# Patient Record
Sex: Female | Born: 1960 | Race: White | Hispanic: No | Marital: Single | Smoking: Former smoker
Health system: Southern US, Community
[De-identification: ages and names within clinical notes are randomized; demographics above are authoritative.]

## PROBLEM LIST (undated history)

## (undated) DIAGNOSIS — F419 Anxiety disorder, unspecified: Secondary | ICD-10-CM

## (undated) DIAGNOSIS — M1990 Unspecified osteoarthritis, unspecified site: Secondary | ICD-10-CM

## (undated) DIAGNOSIS — Z9289 Personal history of other medical treatment: Secondary | ICD-10-CM

## (undated) DIAGNOSIS — K5792 Diverticulitis of intestine, part unspecified, without perforation or abscess without bleeding: Secondary | ICD-10-CM

## (undated) DIAGNOSIS — F329 Major depressive disorder, single episode, unspecified: Secondary | ICD-10-CM

## (undated) DIAGNOSIS — F32A Depression, unspecified: Secondary | ICD-10-CM

## (undated) DIAGNOSIS — R569 Unspecified convulsions: Secondary | ICD-10-CM

## (undated) DIAGNOSIS — G43909 Migraine, unspecified, not intractable, without status migrainosus: Secondary | ICD-10-CM

## (undated) DIAGNOSIS — F909 Attention-deficit hyperactivity disorder, unspecified type: Secondary | ICD-10-CM

## (undated) DIAGNOSIS — J189 Pneumonia, unspecified organism: Secondary | ICD-10-CM

## (undated) DIAGNOSIS — J449 Chronic obstructive pulmonary disease, unspecified: Secondary | ICD-10-CM

## (undated) DIAGNOSIS — K5909 Other constipation: Secondary | ICD-10-CM

## (undated) DIAGNOSIS — M329 Systemic lupus erythematosus, unspecified: Secondary | ICD-10-CM

## (undated) DIAGNOSIS — M2550 Pain in unspecified joint: Secondary | ICD-10-CM

## (undated) DIAGNOSIS — Z8709 Personal history of other diseases of the respiratory system: Secondary | ICD-10-CM

## (undated) DIAGNOSIS — Z9889 Other specified postprocedural states: Secondary | ICD-10-CM

## (undated) DIAGNOSIS — E785 Hyperlipidemia, unspecified: Secondary | ICD-10-CM

## (undated) DIAGNOSIS — R112 Nausea with vomiting, unspecified: Secondary | ICD-10-CM

## (undated) DIAGNOSIS — Z8619 Personal history of other infectious and parasitic diseases: Secondary | ICD-10-CM

## (undated) DIAGNOSIS — J302 Other seasonal allergic rhinitis: Secondary | ICD-10-CM

## (undated) DIAGNOSIS — E119 Type 2 diabetes mellitus without complications: Secondary | ICD-10-CM

## (undated) HISTORY — PX: COLONOSCOPY: SHX174

## (undated) HISTORY — PX: ABDOMINAL HYSTERECTOMY: SHX81

## (undated) HISTORY — PX: TUBAL LIGATION: SHX77

## (undated) HISTORY — PX: TONSILLECTOMY: SUR1361

## (undated) HISTORY — PX: OTHER SURGICAL HISTORY: SHX169

## (undated) HISTORY — PX: PARTIAL COLECTOMY: SHX5273

---

## 2015-12-21 DIAGNOSIS — Z8709 Personal history of other diseases of the respiratory system: Secondary | ICD-10-CM

## 2015-12-21 HISTORY — DX: Personal history of other diseases of the respiratory system: Z87.09

## 2016-03-05 ENCOUNTER — Other Ambulatory Visit: Payer: Self-pay | Admitting: Neurological Surgery

## 2016-03-16 NOTE — Progress Notes (Signed)
Nurse called and spoke with Erie NoeVanessa at Dr. Mervyn Gayitty's office about consent form having two C4-5 in EPIC. Erie NoeVanessa stated the third cervical should state "C5-6 instead of C4-5". Will write consent form as Erie NoeVanessa stated.

## 2016-03-17 ENCOUNTER — Inpatient Hospital Stay (HOSPITAL_COMMUNITY)
Admission: RE | Admit: 2016-03-17 | Discharge: 2016-03-17 | Disposition: A | Discharge status: Home / Self Care | Payer: Self-pay | Source: Ambulatory Visit

## 2016-03-22 ENCOUNTER — Encounter (HOSPITAL_COMMUNITY): Admission: RE | Payer: Self-pay | Source: Ambulatory Visit

## 2016-03-22 ENCOUNTER — Inpatient Hospital Stay (HOSPITAL_COMMUNITY): Admission: RE | Admit: 2016-03-22 | Payer: Medicaid Other | Source: Ambulatory Visit | Admitting: Neurological Surgery

## 2016-03-22 SURGERY — ANTERIOR CERVICAL DECOMPRESSION/DISCECTOMY FUSION 4 LEVELS
Anesthesia: General

## 2016-04-08 ENCOUNTER — Other Ambulatory Visit: Payer: Self-pay | Admitting: Neurological Surgery

## 2016-07-07 ENCOUNTER — Encounter (HOSPITAL_COMMUNITY): Payer: Self-pay

## 2016-07-07 ENCOUNTER — Encounter (HOSPITAL_COMMUNITY)
Admission: RE | Admit: 2016-07-07 | Discharge: 2016-07-07 | Disposition: A | Discharge status: Home / Self Care | Payer: Medicaid Other | Source: Ambulatory Visit | Attending: Neurological Surgery | Admitting: Neurological Surgery

## 2016-07-07 DIAGNOSIS — Z01812 Encounter for preprocedural laboratory examination: Secondary | ICD-10-CM | POA: Diagnosis not present

## 2016-07-07 HISTORY — DX: Other specified postprocedural states: Z98.890

## 2016-07-07 HISTORY — DX: Pain in unspecified joint: M25.50

## 2016-07-07 HISTORY — DX: Personal history of other diseases of the respiratory system: Z87.09

## 2016-07-07 HISTORY — DX: Depression, unspecified: F32.A

## 2016-07-07 HISTORY — DX: Nausea with vomiting, unspecified: R11.2

## 2016-07-07 HISTORY — DX: Hyperlipidemia, unspecified: E78.5

## 2016-07-07 HISTORY — DX: Unspecified osteoarthritis, unspecified site: M19.90

## 2016-07-07 HISTORY — DX: Type 2 diabetes mellitus without complications: E11.9

## 2016-07-07 HISTORY — DX: Personal history of other infectious and parasitic diseases: Z86.19

## 2016-07-07 HISTORY — DX: Chronic obstructive pulmonary disease, unspecified: J44.9

## 2016-07-07 HISTORY — DX: Systemic lupus erythematosus, unspecified: M32.9

## 2016-07-07 HISTORY — DX: Anxiety disorder, unspecified: F41.9

## 2016-07-07 HISTORY — DX: Attention-deficit hyperactivity disorder, unspecified type: F90.9

## 2016-07-07 HISTORY — DX: Migraine, unspecified, not intractable, without status migrainosus: G43.909

## 2016-07-07 HISTORY — DX: Pneumonia, unspecified organism: J18.9

## 2016-07-07 HISTORY — DX: Other seasonal allergic rhinitis: J30.2

## 2016-07-07 HISTORY — DX: Major depressive disorder, single episode, unspecified: F32.9

## 2016-07-07 HISTORY — DX: Personal history of other medical treatment: Z92.89

## 2016-07-07 HISTORY — DX: Other constipation: K59.09

## 2016-07-07 HISTORY — DX: Unspecified convulsions: R56.9

## 2016-07-07 HISTORY — DX: Diverticulitis of intestine, part unspecified, without perforation or abscess without bleeding: K57.92

## 2016-07-07 LAB — BASIC METABOLIC PANEL
Anion gap: 6 (ref 5–15)
BUN: 8 mg/dL (ref 6–20)
CO2: 27 mmol/L (ref 22–32)
CREATININE: 0.74 mg/dL (ref 0.44–1.00)
Calcium: 9.5 mg/dL (ref 8.9–10.3)
Chloride: 107 mmol/L (ref 101–111)
GFR calc Af Amer: >60 mL/min (ref >60)
GLUCOSE: 97 mg/dL (ref 65–99)
Potassium: 3.7 mmol/L (ref 3.5–5.1)
SODIUM: 140 mmol/L (ref 135–145)

## 2016-07-07 LAB — CBC
HCT: 43 % (ref 36.0–46.0)
Hemoglobin: 13.8 g/dL (ref 12.0–15.0)
MCH: 31.3 pg (ref 26.0–34.0)
MCHC: 32.1 g/dL (ref 30.0–36.0)
MCV: 97.5 fL (ref 78.0–100.0)
PLATELETS: 318 10*3/uL (ref 150–400)
RBC: 4.41 MIL/uL (ref 3.87–5.11)
RDW: 13.3 % (ref 11.5–15.5)
WBC: 7.1 10*3/uL (ref 4.0–10.5)

## 2016-07-07 LAB — SURGICAL PCR SCREEN
MRSA, PCR: NEGATIVE
STAPHYLOCOCCUS AUREUS: NEGATIVE

## 2016-07-07 MED ORDER — CEFAZOLIN SODIUM-DEXTROSE 2-4 GM/100ML-% IV SOLN: 2.0000 g | INTRAVENOUS | Status: DC

## 2016-07-07 NOTE — Pre-Procedure Instructions (Signed)
Hannah Macias  07/07/2016      [facility] - [address], [place] - [address] [place] [address] Phone: [phone] Fax: [phone]    Your procedure is scheduled on Mon, July 24 @ 7:30 AM  Report to [facility] Admitting at 5:30 AM  Call this number if you have problems the morning of surgery:  [phone]   Remember:  Do not eat food or drink liquids after midnight.  Take these medicines the morning of surgery with A SIP OF WATER Zovirax(Acyclovir),Albuterol<Bring Your Inhaler With You>,Claritin(Loratadine-if needed),Pain Pill(if needed), and Paxil(Paroxetine)              No Goody's,BC's,Aleve,Aspirin,Ibuprofen,Motrin,Advil,Fish Oil,or any Herbal Medications.     How to Manage Your Diabetes Before and After Surgery  Why is it important to control my blood sugar before and after surgery? . Improving blood sugar levels before and after surgery helps healing and can limit problems. . A way of improving blood sugar control is eating a healthy diet by: o  Eating less sugar and carbohydrates o  Increasing activity/exercise o  Talking with your doctor about reaching your blood sugar goals . High blood sugars (greater than 180 mg/dL) can raise your risk of infections and slow your recovery, so you will need to focus on controlling your diabetes during the weeks before surgery. . Make sure that the doctor who takes care of your diabetes knows about your planned surgery including the date and location.  How do I manage my blood sugar before surgery? . Check your blood sugar at least 4 times a day, starting 2 days before surgery, to make sure that the level is not too high or low. o Check your blood sugar the morning of your surgery when you wake up and every 2 hours until you get to the Short Stay unit. . If your blood sugar is less than 70 mg/dL, you will need to treat for low blood sugar: o Do not take insulin. o Treat a low  blood sugar (less than 70 mg/dL) with  cup of clear juice (cranberry or apple), 4 glucose tablets, OR glucose gel. o Recheck blood sugar in 15 minutes after treatment (to make sure it is greater than 70 mg/dL). If your blood sugar is not greater than 70 mg/dL on recheck, call [phone] for further instructions. . Report your blood sugar to the short stay nurse when you get to Short Stay.  . If you are admitted to the hospital after surgery: o Your blood sugar will be checked by the staff and you will probably be given insulin after surgery (instead of oral diabetes medicines) to make sure you have good blood sugar levels. o The goal for blood sugar control after surgery is 80-180 mg/dL.              WHAT DO I DO ABOUT MY DIABETES MEDICATION?   Marland Kitchen Do not take oral diabetes medicines (pills) the morning of surgery.  .        . The day of surgery, do not take other diabetes injectables, including Byetta (exenatide), Bydureon (exenatide ER), Victoza (liraglutide), or Trulicity (dulaglutide).  . If your CBG is greater than 220 mg/dL, you may take  of your sliding scale (correction) dose of insulin.  Other Instructions:          Patient Signature:  Date:   Nurse Signature:  Date:   Reviewed and Endorsed by [facility]  Patient Education Committee, August 2015   Do not wear jewelry, make-up or nail polish.  Do not wear lotions, powders, or perfumes.   Do not shave 48 hours prior to surgery.    Do not bring valuables to the hospital.  [facility] is not responsible for any belongings or valuables.  Contacts, dentures or bridgework may not be worn into surgery.  Leave your suitcase in the car.  After surgery it may be brought to your room.  For patients admitted to the hospital, discharge time will be determined by your treatment team.  Patients discharged the day of surgery will not be allowed to drive home.    [facility]  [facility]  Before surgery, you can play an important role.  Because skin is not sterile, your skin needs to be as free of germs as possible.  You can reduce the number of germs on you skin by washing with CHG (chlorahexidine gluconate) soap before surgery.  CHG is an antiseptic cleaner which kills germs and bonds with the skin to continue killing germs even after washing.  Please DO NOT use if you have an allergy to CHG or antibacterial soaps.  If your skin becomes reddened/irritated stop using the CHG and inform your nurse when you arrive at Short Stay.  Do not shave (including legs and underarms) for at least 48 hours prior to the first CHG shower.  You may shave your face.  Please follow these instructions carefully:   1.  Shower with CHG Soap the night before surgery and the                                morning of Surgery.  2.  If you choose to wash your hair, wash your hair first as usual with your       normal shampoo.  3.  After you shampoo, rinse your hair and body thoroughly to remove the                      Shampoo.  4.  Use CHG as you would any other liquid soap.  You can apply chg directly       to the skin and wash gently with scrungie or a clean washcloth.  5.  Apply the CHG Soap to your body ONLY FROM THE NECK DOWN.        Do not use on open wounds or open sores.  Avoid contact with your eyes,       ears, mouth and genitals (private parts).  Wash genitals (private parts)       with your normal soap.  6.  Wash thoroughly, paying special attention to the area where your surgery        will be performed.  7.  Thoroughly rinse your body with warm water from the neck down.  8.  DO NOT shower/wash with your normal soap after using and rinsing off       the CHG Soap.  9.  Pat yourself dry with a clean towel.            10.  Wear clean pajamas.            11.  Place clean sheets on your bed the night of your first shower and do not        sleep with pets.  Day of Surgery  Do not  apply  any lotions/deoderants the morning of surgery.  Please wear clean clothes to the hospital/surgery center.     Please read over the following fact sheets that you were given. Pain Booklet and MRSA Information

## 2016-07-07 NOTE — Progress Notes (Addendum)
Cardiologist denies  Medical [place]  Echo denies  Stress test denies  Heart cath denies  [facility]   [facility]

## 2016-07-08 LAB — HEMOGLOBIN A1C
HEMOGLOBIN A1C: 6.2 % — AB (ref 4.8–5.6)
MEAN PLASMA GLUCOSE: 131 mg/dL

## 2016-07-12 ENCOUNTER — Encounter (HOSPITAL_COMMUNITY): Payer: Self-pay | Admitting: Surgery

## 2016-07-12 ENCOUNTER — Inpatient Hospital Stay (HOSPITAL_COMMUNITY): Payer: Medicaid Other

## 2016-07-12 ENCOUNTER — Inpatient Hospital Stay (HOSPITAL_COMMUNITY): Payer: Medicaid Other | Admitting: Anesthesiology

## 2016-07-12 ENCOUNTER — Encounter (HOSPITAL_COMMUNITY)
Admission: RE | Disposition: A | Discharge status: Home / Self Care | Payer: Self-pay | Source: Ambulatory Visit | Attending: Neurological Surgery

## 2016-07-12 ENCOUNTER — Ambulatory Visit (HOSPITAL_COMMUNITY)
Admission: RE | Admit: 2016-07-12 | Discharge: 2016-07-13 | Disposition: A | Discharge status: Home / Self Care | Payer: Medicaid Other | Source: Ambulatory Visit | Attending: Neurological Surgery | Admitting: Neurological Surgery

## 2016-07-12 DIAGNOSIS — Z7951 Long term (current) use of inhaled steroids: Secondary | ICD-10-CM | POA: Diagnosis not present

## 2016-07-12 DIAGNOSIS — M4722 Other spondylosis with radiculopathy, cervical region: Principal | ICD-10-CM | POA: Diagnosis present

## 2016-07-12 DIAGNOSIS — F329 Major depressive disorder, single episode, unspecified: Secondary | ICD-10-CM | POA: Insufficient documentation

## 2016-07-12 DIAGNOSIS — F419 Anxiety disorder, unspecified: Secondary | ICD-10-CM | POA: Diagnosis not present

## 2016-07-12 DIAGNOSIS — E119 Type 2 diabetes mellitus without complications: Secondary | ICD-10-CM | POA: Insufficient documentation

## 2016-07-12 DIAGNOSIS — K5909 Other constipation: Secondary | ICD-10-CM | POA: Diagnosis not present

## 2016-07-12 DIAGNOSIS — Z79899 Other long term (current) drug therapy: Secondary | ICD-10-CM | POA: Insufficient documentation

## 2016-07-12 DIAGNOSIS — G43909 Migraine, unspecified, not intractable, without status migrainosus: Secondary | ICD-10-CM | POA: Diagnosis not present

## 2016-07-12 DIAGNOSIS — M329 Systemic lupus erythematosus, unspecified: Secondary | ICD-10-CM | POA: Diagnosis not present

## 2016-07-12 DIAGNOSIS — Z7984 Long term (current) use of oral hypoglycemic drugs: Secondary | ICD-10-CM | POA: Diagnosis not present

## 2016-07-12 DIAGNOSIS — E785 Hyperlipidemia, unspecified: Secondary | ICD-10-CM | POA: Diagnosis not present

## 2016-07-12 DIAGNOSIS — J449 Chronic obstructive pulmonary disease, unspecified: Secondary | ICD-10-CM | POA: Diagnosis not present

## 2016-07-12 DIAGNOSIS — Z87891 Personal history of nicotine dependence: Secondary | ICD-10-CM | POA: Insufficient documentation

## 2016-07-12 DIAGNOSIS — Z419 Encounter for procedure for purposes other than remedying health state, unspecified: Secondary | ICD-10-CM

## 2016-07-12 HISTORY — PX: ANTERIOR CERVICAL DECOMPRESSION/DISCECTOMY FUSION 4 LEVELS: SHX5556

## 2016-07-12 LAB — URINALYSIS, ROUTINE W REFLEX MICROSCOPIC
Bilirubin Urine: NEGATIVE
Glucose, UA: >1000 mg/dL — AB
KETONES UR: NEGATIVE mg/dL
LEUKOCYTES UA: NEGATIVE
NITRITE: NEGATIVE
PROTEIN: NEGATIVE mg/dL
Specific Gravity, Urine: 1.02 (ref 1.005–1.030)
pH: 7 (ref 5.0–8.0)

## 2016-07-12 LAB — GLUCOSE, CAPILLARY
GLUCOSE-CAPILLARY: 115 mg/dL — AB (ref 65–99)
GLUCOSE-CAPILLARY: 171 mg/dL — AB (ref 65–99)
GLUCOSE-CAPILLARY: 196 mg/dL — AB (ref 65–99)
Glucose-Capillary: 158 mg/dL — ABNORMAL HIGH (ref 65–99)

## 2016-07-12 LAB — URINE MICROSCOPIC-ADD ON

## 2016-07-12 SURGERY — ANTERIOR CERVICAL DECOMPRESSION/DISCECTOMY FUSION 4 LEVELS
Anesthesia: General

## 2016-07-12 MED ORDER — PROPOFOL 10 MG/ML IV BOLUS
INTRAVENOUS | Status: DC | PRN
  Administered 2016-07-12: 160 mg via INTRAVENOUS

## 2016-07-12 MED ORDER — ZOLPIDEM TARTRATE 5 MG PO TABS: 10.0000 mg | ORAL_TABLET | Freq: Every day | ORAL | Status: DC

## 2016-07-12 MED ORDER — GLYCOPYRROLATE 0.2 MG/ML IV SOSY
PREFILLED_SYRINGE | INTRAVENOUS | Status: AC
  Filled 2016-07-12: qty 3

## 2016-07-12 MED ORDER — LIDOCAINE 2% (20 MG/ML) 5 ML SYRINGE
INTRAMUSCULAR | Status: DC | PRN
  Administered 2016-07-12: 40 mg via INTRAVENOUS

## 2016-07-12 MED ORDER — ONDANSETRON HCL 4 MG/2ML IJ SOLN
INTRAMUSCULAR | Status: DC | PRN
  Administered 2016-07-12: 4 mg via INTRAVENOUS

## 2016-07-12 MED ORDER — GLYCOPYRROLATE 0.2 MG/ML IV SOSY
PREFILLED_SYRINGE | INTRAVENOUS | Status: DC | PRN
  Administered 2016-07-12: .4 mg via INTRAVENOUS

## 2016-07-12 MED ORDER — MONTELUKAST SODIUM 10 MG PO TABS: 10.0000 mg | ORAL_TABLET | Freq: Every day | ORAL | Status: DC

## 2016-07-12 MED ORDER — SENNA 8.6 MG PO TABS
1.0000 | ORAL_TABLET | Freq: Two times a day (BID) | ORAL | Status: DC
  Administered 2016-07-12: 8.6 mg via ORAL
  Filled 2016-07-12: qty 1

## 2016-07-12 MED ORDER — SODIUM CHLORIDE 0.9% FLUSH: 3.0000 mL | INTRAVENOUS | Status: DC | PRN

## 2016-07-12 MED ORDER — ROCURONIUM BROMIDE 10 MG/ML (PF) SYRINGE
PREFILLED_SYRINGE | INTRAVENOUS | Status: DC | PRN
  Administered 2016-07-12: 10 mg via INTRAVENOUS
  Administered 2016-07-12: 50 mg via INTRAVENOUS
  Administered 2016-07-12 (×3): 10 mg via INTRAVENOUS

## 2016-07-12 MED ORDER — PAROXETINE HCL 10 MG PO TABS
5.0000 mg | ORAL_TABLET | Freq: Every day | ORAL | Status: DC
  Administered 2016-07-13: 5 mg via ORAL
  Filled 2016-07-12: qty 0.5

## 2016-07-12 MED ORDER — OXYCODONE HCL 5 MG PO TABS: 5.0000 mg | ORAL_TABLET | ORAL | Status: DC | PRN

## 2016-07-12 MED ORDER — EPHEDRINE 5 MG/ML INJ
INTRAVENOUS | Status: AC
  Filled 2016-07-12: qty 10

## 2016-07-12 MED ORDER — HYDROMORPHONE HCL 1 MG/ML IJ SOLN
0.5000 mg | INTRAMUSCULAR | Status: DC | PRN
  Administered 2016-07-12 (×2): 1 mg via INTRAVENOUS
  Filled 2016-07-12 (×2): qty 1

## 2016-07-12 MED ORDER — CARISOPRODOL 350 MG PO TABS
350.0000 mg | ORAL_TABLET | Freq: Four times a day (QID) | ORAL | Status: DC | PRN
  Administered 2016-07-12 – 2016-07-13 (×2): 350 mg via ORAL
  Filled 2016-07-12 (×2): qty 1

## 2016-07-12 MED ORDER — DEXAMETHASONE SODIUM PHOSPHATE 4 MG/ML IJ SOLN
2.0000 mg | Freq: Three times a day (TID) | INTRAMUSCULAR | Status: AC
  Administered 2016-07-12 – 2016-07-13 (×3): 2 mg via INTRAVENOUS
  Filled 2016-07-12 (×3): qty 1

## 2016-07-12 MED ORDER — FLUTICASONE PROPIONATE 50 MCG/ACT NA SUSP
2.0000 | Freq: Every day | NASAL | Status: DC
  Administered 2016-07-12: 2 via NASAL
  Filled 2016-07-12: qty 16

## 2016-07-12 MED ORDER — HYDROXYCHLOROQUINE SULFATE 200 MG PO TABS
200.0000 mg | ORAL_TABLET | Freq: Two times a day (BID) | ORAL | Status: DC
Start: 2016-07-12 — End: 2016-07-13
  Administered 2016-07-12 – 2016-07-13 (×2): 200 mg via ORAL
  Filled 2016-07-12 (×2): qty 1

## 2016-07-12 MED ORDER — DEXAMETHASONE SODIUM PHOSPHATE 10 MG/ML IJ SOLN
INTRAMUSCULAR | Status: DC | PRN
  Administered 2016-07-12 (×2): 5 mg via INTRAVENOUS

## 2016-07-12 MED ORDER — FENTANYL CITRATE (PF) 100 MCG/2ML IJ SOLN
INTRAMUSCULAR | Status: AC
  Filled 2016-07-12: qty 2

## 2016-07-12 MED ORDER — MIDAZOLAM HCL 2 MG/2ML IJ SOLN
INTRAMUSCULAR | Status: AC
  Filled 2016-07-12: qty 2

## 2016-07-12 MED ORDER — ONDANSETRON HCL 4 MG/2ML IJ SOLN
INTRAMUSCULAR | Status: AC
  Filled 2016-07-12: qty 2

## 2016-07-12 MED ORDER — DOCUSATE SODIUM 100 MG PO CAPS
100.0000 mg | ORAL_CAPSULE | Freq: Two times a day (BID) | ORAL | Status: DC
  Administered 2016-07-12: 100 mg via ORAL
  Filled 2016-07-12: qty 1

## 2016-07-12 MED ORDER — PROPOFOL 10 MG/ML IV BOLUS
INTRAVENOUS | Status: AC
  Filled 2016-07-12: qty 40

## 2016-07-12 MED ORDER — CARISOPRODOL 350 MG PO TABS: 700.0000 mg | ORAL_TABLET | Freq: Three times a day (TID) | ORAL | Status: DC | PRN

## 2016-07-12 MED ORDER — MIDAZOLAM HCL 5 MG/5ML IJ SOLN
INTRAMUSCULAR | Status: DC | PRN
  Administered 2016-07-12: 2 mg via INTRAVENOUS

## 2016-07-12 MED ORDER — DEXAMETHASONE SODIUM PHOSPHATE 10 MG/ML IJ SOLN
INTRAMUSCULAR | Status: AC
  Filled 2016-07-12: qty 2

## 2016-07-12 MED ORDER — TRAMADOL HCL 50 MG PO TABS: 50.0000 mg | ORAL_TABLET | Freq: Four times a day (QID) | ORAL | Status: DC | PRN

## 2016-07-12 MED ORDER — 0.9 % SODIUM CHLORIDE (POUR BTL) OPTIME
TOPICAL | Status: DC | PRN
  Administered 2016-07-12: 1000 mL

## 2016-07-12 MED ORDER — SODIUM CHLORIDE 0.9 % IV SOLN: INTRAVENOUS | Status: DC

## 2016-07-12 MED ORDER — FENTANYL CITRATE (PF) 100 MCG/2ML IJ SOLN
INTRAMUSCULAR | Status: DC | PRN
  Administered 2016-07-12 (×2): 25 ug via INTRAVENOUS
  Administered 2016-07-12 (×3): 50 ug via INTRAVENOUS
  Administered 2016-07-12: 25 ug via INTRAVENOUS
  Administered 2016-07-12: 50 ug via INTRAVENOUS
  Administered 2016-07-12: 25 ug via INTRAVENOUS
  Administered 2016-07-12: 100 ug via INTRAVENOUS
  Administered 2016-07-12: 50 ug via INTRAVENOUS

## 2016-07-12 MED ORDER — FENTANYL CITRATE (PF) 100 MCG/2ML IJ SOLN: 50.0000 ug | Freq: Once | INTRAMUSCULAR | Status: DC

## 2016-07-12 MED ORDER — ZOLPIDEM TARTRATE 5 MG PO TABS
5.0000 mg | ORAL_TABLET | Freq: Every evening | ORAL | Status: DC | PRN
  Administered 2016-07-12: 5 mg via ORAL
  Filled 2016-07-12: qty 1

## 2016-07-12 MED ORDER — THROMBIN 5000 UNITS EX SOLR
OROMUCOSAL | Status: DC | PRN
  Administered 2016-07-12 (×2): via TOPICAL
  Administered 2016-07-12: 5 mL via TOPICAL

## 2016-07-12 MED ORDER — LORATADINE 10 MG PO TABS: 10.0000 mg | ORAL_TABLET | Freq: Every day | ORAL | Status: DC | PRN

## 2016-07-12 MED ORDER — MENTHOL 3 MG MT LOZG: 1.0000 | LOZENGE | OROMUCOSAL | Status: DC | PRN

## 2016-07-12 MED ORDER — OXYCODONE-ACETAMINOPHEN 7.5-325 MG PO TABS
2.0000 | ORAL_TABLET | ORAL | Status: DC | PRN
  Administered 2016-07-12: 2 via ORAL
  Filled 2016-07-12: qty 2

## 2016-07-12 MED ORDER — CEFAZOLIN SODIUM 1 G IJ SOLR
INTRAMUSCULAR | Status: AC
  Filled 2016-07-12: qty 20

## 2016-07-12 MED ORDER — HYDROMORPHONE HCL 2 MG PO TABS
2.0000 mg | ORAL_TABLET | ORAL | Status: DC | PRN
  Administered 2016-07-12 – 2016-07-13 (×2): 2 mg via ORAL
  Filled 2016-07-12 (×2): qty 1

## 2016-07-12 MED ORDER — METFORMIN HCL 500 MG PO TABS
500.0000 mg | ORAL_TABLET | Freq: Three times a day (TID) | ORAL | Status: DC
  Administered 2016-07-12 – 2016-07-13 (×3): 500 mg via ORAL
  Filled 2016-07-12 (×3): qty 1

## 2016-07-12 MED ORDER — NEOSTIGMINE METHYLSULFATE 5 MG/5ML IV SOSY
PREFILLED_SYRINGE | INTRAVENOUS | Status: DC | PRN
  Administered 2016-07-12: 3 mg via INTRAVENOUS

## 2016-07-12 MED ORDER — CARISOPRODOL 350 MG PO TABS
350.0000 mg | ORAL_TABLET | Freq: Three times a day (TID) | ORAL | Status: DC | PRN
  Administered 2016-07-12: 350 mg via ORAL
  Filled 2016-07-12: qty 1

## 2016-07-12 MED ORDER — FENTANYL CITRATE (PF) 250 MCG/5ML IJ SOLN
INTRAMUSCULAR | Status: AC
  Filled 2016-07-12: qty 5

## 2016-07-12 MED ORDER — LIDOCAINE 2% (20 MG/ML) 5 ML SYRINGE
INTRAMUSCULAR | Status: AC
  Filled 2016-07-12: qty 5

## 2016-07-12 MED ORDER — BISACODYL 10 MG RE SUPP: 10.0000 mg | Freq: Every day | RECTAL | Status: DC | PRN

## 2016-07-12 MED ORDER — SUMATRIPTAN SUCCINATE 25 MG PO TABS
25.0000 mg | ORAL_TABLET | ORAL | Status: DC | PRN
  Administered 2016-07-12: 25 mg via ORAL
  Filled 2016-07-12 (×3): qty 1

## 2016-07-12 MED ORDER — EPHEDRINE SULFATE-NACL 50-0.9 MG/10ML-% IV SOSY
PREFILLED_SYRINGE | INTRAVENOUS | Status: DC | PRN
  Administered 2016-07-12: 5 mg via INTRAVENOUS
  Administered 2016-07-12: 10 mg via INTRAVENOUS

## 2016-07-12 MED ORDER — PANTOPRAZOLE SODIUM 20 MG PO TBEC
20.0000 mg | DELAYED_RELEASE_TABLET | Freq: Every day | ORAL | Status: DC
  Administered 2016-07-12 – 2016-07-13 (×2): 20 mg via ORAL
  Filled 2016-07-12 (×2): qty 1

## 2016-07-12 MED ORDER — LACTATED RINGERS IV SOLN
INTRAVENOUS | Status: DC | PRN
  Administered 2016-07-12 (×3): via INTRAVENOUS

## 2016-07-12 MED ORDER — ACYCLOVIR 200 MG PO CAPS
200.0000 mg | ORAL_CAPSULE | Freq: Two times a day (BID) | ORAL | Status: DC
Start: 2016-07-12 — End: 2016-07-13
  Administered 2016-07-12 – 2016-07-13 (×2): 200 mg via ORAL
  Filled 2016-07-12 (×2): qty 1

## 2016-07-12 MED ORDER — ALBUTEROL SULFATE (2.5 MG/3ML) 0.083% IN NEBU: 3.0000 mL | INHALATION_SOLUTION | Freq: Four times a day (QID) | RESPIRATORY_TRACT | Status: DC | PRN

## 2016-07-12 MED ORDER — FLEET ENEMA 7-19 GM/118ML RE ENEM: 1.0000 | ENEMA | Freq: Once | RECTAL | Status: DC | PRN

## 2016-07-12 MED ORDER — CEFAZOLIN IN D5W 1 GM/50ML IV SOLN
1.0000 g | Freq: Three times a day (TID) | INTRAVENOUS | Status: AC
  Administered 2016-07-12 (×2): 1 g via INTRAVENOUS
  Filled 2016-07-12 (×2): qty 50

## 2016-07-12 MED ORDER — ALPRAZOLAM 0.5 MG PO TABS: 1.0000 mg | ORAL_TABLET | Freq: Every day | ORAL | Status: DC

## 2016-07-12 MED ORDER — PHENOL 1.4 % MT LIQD
1.0000 | OROMUCOSAL | Status: DC | PRN
  Administered 2016-07-12: 1 via OROMUCOSAL
  Filled 2016-07-12: qty 177

## 2016-07-12 MED ORDER — ROCURONIUM BROMIDE 50 MG/5ML IV SOLN
INTRAVENOUS | Status: AC
  Filled 2016-07-12: qty 2

## 2016-07-12 MED ORDER — ACETAMINOPHEN 500 MG PO TABS
1000.0000 mg | ORAL_TABLET | Freq: Four times a day (QID) | ORAL | Status: DC
  Administered 2016-07-12 – 2016-07-13 (×4): 1000 mg via ORAL
  Filled 2016-07-12 (×4): qty 2

## 2016-07-12 MED ORDER — BUPIVACAINE-EPINEPHRINE (PF) 0.5% -1:200000 IJ SOLN
INTRAMUSCULAR | Status: DC | PRN
  Administered 2016-07-12: 10 mL via PERINEURAL

## 2016-07-12 MED ORDER — FENTANYL CITRATE (PF) 100 MCG/2ML IJ SOLN
25.0000 ug | INTRAMUSCULAR | Status: DC | PRN
  Administered 2016-07-12 (×2): 50 ug via INTRAVENOUS

## 2016-07-12 MED ORDER — BISACODYL 5 MG PO TBEC
10.0000 mg | DELAYED_RELEASE_TABLET | Freq: Every evening | ORAL | Status: DC
  Administered 2016-07-12: 10 mg via ORAL
  Filled 2016-07-12: qty 2

## 2016-07-12 MED ORDER — CLOTRIMAZOLE 1 % VA CREA
1.0000 | TOPICAL_CREAM | Freq: Every day | VAGINAL | Status: DC
  Administered 2016-07-12: 1 via VAGINAL
  Filled 2016-07-12: qty 45

## 2016-07-12 MED ORDER — GABAPENTIN 300 MG PO CAPS
300.0000 mg | ORAL_CAPSULE | Freq: Three times a day (TID) | ORAL | Status: DC
Start: 2016-07-12 — End: 2016-07-13
  Administered 2016-07-12 – 2016-07-13 (×3): 300 mg via ORAL
  Filled 2016-07-12 (×3): qty 1

## 2016-07-12 MED ORDER — SIMVASTATIN 20 MG PO TABS
10.0000 mg | ORAL_TABLET | Freq: Every day | ORAL | Status: DC
  Administered 2016-07-12: 10 mg via ORAL
  Filled 2016-07-12: qty 1

## 2016-07-12 MED ORDER — LIDOCAINE-EPINEPHRINE 2 %-1:100000 IJ SOLN
INTRAMUSCULAR | Status: DC | PRN
  Administered 2016-07-12: 10 mL via INTRADERMAL

## 2016-07-12 MED ORDER — CEFAZOLIN SODIUM-DEXTROSE 2-3 GM-% IV SOLR
INTRAVENOUS | Status: DC | PRN
  Administered 2016-07-12 (×2): 2 g via INTRAVENOUS

## 2016-07-12 MED ORDER — ONDANSETRON HCL 4 MG/2ML IJ SOLN: 4.0000 mg | Freq: Four times a day (QID) | INTRAMUSCULAR | Status: DC | PRN

## 2016-07-12 MED ORDER — SODIUM CHLORIDE 0.9 % IV SOLN: 250.0000 mL | INTRAVENOUS | Status: DC

## 2016-07-12 MED ORDER — SODIUM CHLORIDE 0.9 % IR SOLN
Status: DC | PRN
  Administered 2016-07-12: 07:00:00

## 2016-07-12 MED ORDER — NEOSTIGMINE METHYLSULFATE 5 MG/5ML IV SOSY
PREFILLED_SYRINGE | INTRAVENOUS | Status: AC
  Filled 2016-07-12: qty 5

## 2016-07-12 MED ORDER — ALBUTEROL SULFATE (2.5 MG/3ML) 0.083% IN NEBU: 2.5000 mg | INHALATION_SOLUTION | Freq: Every day | RESPIRATORY_TRACT | Status: DC

## 2016-07-12 MED ORDER — SODIUM CHLORIDE 0.9% FLUSH: 3.0000 mL | Freq: Two times a day (BID) | INTRAVENOUS | Status: DC

## 2016-07-12 MED ORDER — ONDANSETRON HCL 4 MG/2ML IJ SOLN: 4.0000 mg | INTRAMUSCULAR | Status: DC | PRN

## 2016-07-12 MED ORDER — ALPRAZOLAM 0.5 MG PO TABS
1.0000 mg | ORAL_TABLET | Freq: Two times a day (BID) | ORAL | Status: DC
  Administered 2016-07-12 – 2016-07-13 (×3): 1 mg via ORAL
  Filled 2016-07-12 (×3): qty 2

## 2016-07-12 MED ORDER — CELECOXIB 200 MG PO CAPS
200.0000 mg | ORAL_CAPSULE | Freq: Two times a day (BID) | ORAL | Status: DC
Start: 2016-07-12 — End: 2016-07-13
  Administered 2016-07-12: 200 mg via ORAL
  Filled 2016-07-12 (×2): qty 1

## 2016-07-12 MED ORDER — THROMBIN 20000 UNITS EX SOLR
CUTANEOUS | Status: DC | PRN
  Administered 2016-07-12: 07:00:00 via TOPICAL

## 2016-07-12 SURGICAL SUPPLY — 70 items
APPLICATOR CHLORAPREP 3ML ORNG (MISCELLANEOUS) ×3 IMPLANT
ARCHONACP PINTEMP FIXATION ENGAGING ×3 IMPLANT
BIT DRILL NEURO 2X3.1 SFT TUCH (MISCELLANEOUS) ×1 IMPLANT
BIT DRILL POWER (BIT) ×1 IMPLANT
BLADE SURG 11 STRL SS (BLADE) ×3 IMPLANT
BLADE ULTRA TIP 2M (BLADE) IMPLANT
BONE MATRIX OSTEOCEL PRO MED (Bone Implant) ×3 IMPLANT
BONE MATRIX OSTEOCEL PRO SM (Bone Implant) ×3 IMPLANT
BUR MATCHSTICK NEURO 3.0 LAGG (BURR) ×3 IMPLANT
CAGE COROENT ACR 10X19X16-10 (Cage) ×2 IMPLANT
CAGE COROENT SM ACR 7X19X16-10 (Cage) ×6 IMPLANT
CAGE COROENT SM ACR 8X19X16-10 (Cage) ×3 IMPLANT
CANISTER SUCT 3000ML PPV (MISCELLANEOUS) ×3 IMPLANT
DECANTER SPIKE VIAL GLASS SM (MISCELLANEOUS) ×3 IMPLANT
DERMABOND ADVANCED (GAUZE/BANDAGES/DRESSINGS) ×2
DERMABOND ADVANCED .7 DNX12 (GAUZE/BANDAGES/DRESSINGS) ×1 IMPLANT
DRAIN JACKSON PRATT 1/4 1325 (MISCELLANEOUS) ×3 IMPLANT
DRAPE C-ARM 42X72 X-RAY (DRAPES) ×6 IMPLANT
DRAPE C-ARMOR (DRAPES) ×3 IMPLANT
DRAPE LAPAROTOMY 100X72 PEDS (DRAPES) ×3 IMPLANT
DRAPE MICROSCOPE LEICA (MISCELLANEOUS) ×3 IMPLANT
DRAPE POUCH INSTRU U-SHP 10X18 (DRAPES) ×3 IMPLANT
DRAPE PROXIMA HALF (DRAPES) IMPLANT
DRAPE SHEET LG 3/4 BI-LAMINATE (DRAPES) ×3 IMPLANT
DRILL BIT POWER (BIT) ×2
DRILL NEURO 2X3.1 SOFT TOUCH (MISCELLANEOUS) ×3
DRSG OPSITE POSTOP 4X6 (GAUZE/BANDAGES/DRESSINGS) ×3 IMPLANT
ELECT COATED BLADE 2.86 ST (ELECTRODE) ×3 IMPLANT
ELECT REM PT RETURN 9FT ADLT (ELECTROSURGICAL) ×3
ELECTRODE REM PT RTRN 9FT ADLT (ELECTROSURGICAL) ×1 IMPLANT
EVACUATOR 1/8 PVC DRAIN (DRAIN) IMPLANT
EVACUATOR SILICONE 100CC (DRAIN) ×3 IMPLANT
GAUZE SPONGE 4X4 12PLY STRL (GAUZE/BANDAGES/DRESSINGS) IMPLANT
GAUZE SPONGE 4X4 16PLY XRAY LF (GAUZE/BANDAGES/DRESSINGS) IMPLANT
GLOVE BIOGEL PI IND STRL 7.5 (GLOVE) ×1 IMPLANT
GLOVE BIOGEL PI INDICATOR 7.5 (GLOVE) ×2
GLOVE EXAM NITRILE LRG STRL (GLOVE) IMPLANT
GLOVE SS BIOGEL STRL SZ 7 (GLOVE) IMPLANT
GLOVE SUPERSENSE BIOGEL SZ 7 (GLOVE)
GOWN STRL REUS W/ TWL LRG LVL3 (GOWN DISPOSABLE) ×2 IMPLANT
GOWN STRL REUS W/ TWL XL LVL3 (GOWN DISPOSABLE) ×2 IMPLANT
GOWN STRL REUS W/TWL LRG LVL3 (GOWN DISPOSABLE) ×4
GOWN STRL REUS W/TWL XL LVL3 (GOWN DISPOSABLE) ×4
HEMOSTAT POWDER KIT SURGIFOAM (HEMOSTASIS) ×9 IMPLANT
KIT BASIN OR (CUSTOM PROCEDURE TRAY) ×3 IMPLANT
KIT ROOM TURNOVER OR (KITS) ×3 IMPLANT
NEEDLE HYPO 25X1 1.5 SAFETY (NEEDLE) ×3 IMPLANT
NEEDLE SPNL 22GX3.5 QUINCKE BK (NEEDLE) ×3 IMPLANT
NS IRRIG 1000ML POUR BTL (IV SOLUTION) ×3 IMPLANT
PACK LAMINECTOMY NEURO (CUSTOM PROCEDURE TRAY) ×3 IMPLANT
PACK UNIVERSAL I (CUSTOM PROCEDURE TRAY) ×6 IMPLANT
PAD ARMBOARD 7.5X6 YLW CONV (MISCELLANEOUS) ×3 IMPLANT
PATTIES SURGICAL .5X1.5 (GAUZE/BANDAGES/DRESSINGS) ×6 IMPLANT
PLATE ARCHON 4-LEVEL 78MM (Plate) ×3 IMPLANT
RUBBERBAND STERILE (MISCELLANEOUS) ×6 IMPLANT
SCREW ARCHON ST FIXED 4X17 (Screw) ×6 IMPLANT
SCREW ARCHON ST VAR 4.0X17MM (Screw) ×24 IMPLANT
SPONGE INTESTINAL PEANUT (DISPOSABLE) ×6 IMPLANT
SPONGE SURGIFOAM ABS GEL 100 (HEMOSTASIS) ×3 IMPLANT
SPONGE SURGIFOAM ABS GEL SZ50 (HEMOSTASIS) IMPLANT
STAPLER VISISTAT 35W (STAPLE) ×3 IMPLANT
STOCKINETTE 6  STRL (DRAPES) ×2
STOCKINETTE 6 STRL (DRAPES) ×1 IMPLANT
SUT SILK 3 0 TIES 10X30 (SUTURE) ×3 IMPLANT
SUT VIC AB 3-0 SH 8-18 (SUTURE) ×3 IMPLANT
TOWEL OR 17X24 6PK STRL BLUE (TOWEL DISPOSABLE) ×6 IMPLANT
TOWEL OR 17X26 10 PK STRL BLUE (TOWEL DISPOSABLE) ×3 IMPLANT
TUBE CONNECTING 12'X1/4 (SUCTIONS) ×1
TUBE CONNECTING 12X1/4 (SUCTIONS) ×2 IMPLANT
WATER STERILE IRR 1000ML POUR (IV SOLUTION) ×3 IMPLANT

## 2016-07-12 NOTE — Anesthesia Procedure Notes (Addendum)
Procedure Name: Intubation Date/Time: 07/12/2016 8:08 AM Performed by: Daiva Eves Pre-anesthesia Checklist: Patient identified Patient Re-evaluated:Patient Re-evaluated prior to inductionOxygen Delivery Method: Circle system utilized Preoxygenation: Pre-oxygenation with 100% oxygen Intubation Type: IV induction Ventilation: Mask ventilation without difficulty Grade View: Grade I Tube size: 7.5 mm Number of attempts: 1 Airway Equipment and Method: Video-laryngoscopy Placement Confirmation: ETT inserted through vocal cords under direct vision,  positive ETCO2,  CO2 detector and breath sounds checked- equal and bilateral Secured at: 22 cm Tube secured with: Tape Dental Injury: Teeth and Oropharynx as per pre-operative assessment

## 2016-07-12 NOTE — Transfer of Care (Signed)
Immediate Anesthesia Transfer of Care Note  Patient: Hannah Macias  Procedure(s) Performed: Procedure(s): CERVICAL THREE-FOUR CERVICAL FOUR-FIVE CERVICAL FIVE-SIX CERVICAL SIX-SEVEN  ANTERIOR CERVICAL DECOMPRESSION DISKECTOMY FUSION (N/A)  Patient Location: PACU  Anesthesia Type:General  Level of Consciousness: oriented and patient cooperative  Airway & Oxygen Therapy: Patient Spontanous Breathing and Patient connected to nasal cannula oxygen  Post-op Assessment: Report given to RN and Post -op Vital signs reviewed and stable  Post vital signs: Reviewed and stable  Last Vitals:  Vitals:   07/12/16 0651 07/12/16 1211  BP: 133/83   Pulse: 60   Resp: 20   Temp: 36.6 C (P) 36.6 C    Last Pain:  Vitals:   07/12/16 0651  TempSrc: Oral  PainSc:       Patients Stated Pain Goal: 3 (07/12/16 1448)  Complications: No apparent anesthesia complications

## 2016-07-12 NOTE — Brief Op Note (Signed)
07/12/2016  11:54 AM  PATIENT:  Louann Sjogren  55 y.o. female  PRE-OPERATIVE DIAGNOSIS:  Cervical spondylosis with radiculopathy  POST-OPERATIVE DIAGNOSIS:  Cervical spondylosis with radiculopathy  PROCEDURE:  Procedure(s): CERVICAL THREE-FOUR CERVICAL FOUR-FIVE CERVICAL FIVE-SIX CERVICAL SIX-SEVEN  ANTERIOR CERVICAL DECOMPRESSION DISKECTOMY FUSION (N/A)  SURGEON:  Surgeon(s) and Role:    * Truitt Merle, MD - Primary    * Tressie Stalker, MD - Assisting  PHYSICIAN ASSISTANT:   ASSISTANTS: Tressie Stalker, MD  ANESTHESIA:   general  EBL:  Total I/O In: 2000 [I.V.:2000] Out: 1375 [Urine:1000; Blood:375]  BLOOD ADMINISTERED:none  DRAINS: JP  LOCAL MEDICATIONS USED:  MARCAINE    and LIDOCAINE   SPECIMEN:  No Specimen  DISPOSITION OF SPECIMEN:  PATHOLOGY  COUNTS:  YES  TOURNIQUET:  * No tourniquets in log *  DICTATION: .Dragon Dictation  PLAN OF CARE: Admit to inpatient   PATIENT DISPOSITION:  PACU - hemodynamically stable.   Delay start of Pharmacological VTE agent (>24hrs) due to surgical blood loss or risk of bleeding: yes

## 2016-07-12 NOTE — Anesthesia Postprocedure Evaluation (Signed)
Anesthesia Post Note  Patient: Hannah Macias  Procedure(s) Performed: Procedure(s) (LRB): CERVICAL THREE-FOUR CERVICAL FOUR-FIVE CERVICAL FIVE-SIX CERVICAL SIX-SEVEN  ANTERIOR CERVICAL DECOMPRESSION DISKECTOMY FUSION (N/A)  Patient location during evaluation: PACU Anesthesia Type: General Level of consciousness: awake, awake and alert and oriented Pain management: pain level controlled Vital Signs Assessment: post-procedure vital signs reviewed and stable Respiratory status: spontaneous breathing, nonlabored ventilation and respiratory function stable Cardiovascular status: blood pressure returned to baseline Anesthetic complications: no    Last Vitals:  Vitals:   07/12/16 1254 07/12/16 1325  BP:  (!) 156/91  Pulse:  90  Resp:  16  Temp: 36.7 C 36.8 C    Last Pain:  Vitals:   07/12/16 1607  TempSrc:   PainSc: 10-Worst pain ever                 Quaneisha Hanisch COKER

## 2016-07-12 NOTE — Progress Notes (Signed)
Pt falling asleep , cant complete sentences, will state 10 then fall back asleep and snore

## 2016-07-12 NOTE — Op Note (Addendum)
07/12/2016  8:21 PM  PATIENT:  Hannah Macias  55 y.o. female  PRE-OPERATIVE DIAGNOSIS:  Cervical spondylosis with radiculopathy  POST-OPERATIVE DIAGNOSIS:  Same  PROCEDURE:  C3-7 anterior discectomy with fusion and plate fixation; use of PEEK spacers  SURGEON:  Hulan Saas, MD  ASSISTANTS: Tressie Stalker, MD  ANESTHESIA:   General  DRAINS: JP  SPECIMEN: None  INDICATION FOR PROCEDURE: 55 year old female with cervical radiculopathy not responsive to medical management.  I recommended the above operation. Patient understood the risks, benefits, and alternatives and potential outcomes and wished to proceed.  PROCEDURE DETAILS: Patient was brought to the operating room placed under general endotracheal anesthesia. Patient was placed in the supine position on the operating room table. The neck was prepped with betadine and chloraprep and draped in a sterile fashion.   A transverse incision was made on the right side of the neck. Dissection was carried down thru the subcutaneous tissue and the platysma was elevated, opened vertically, and undermined with Metzenbaum scissors. Dissection was then carried out thru an avascular plane leaving the sternocleidomastoid, carotid artery, and jugular vein laterally and the trachea and esophagus medially. The ventral aspect of the vertebral column was identified and a localizing x-ray was taken. The C3-4 level was identified. The longus colli muscles were then elevated and the retractor was placed. The annulus was incised and the disc space entered. Discectomy was performed with micro-curettes and pituitary and kerrison rongeurs. I then used the high-speed drill to drill the endplates down to the level of the posterior longitudinal ligament. The operating microscope was draped and brought into the field provided additional magnification, illumination and visualization. Utilizing microsurgical technique, discectomy was continued posteriorly thru  the disc space. Posterior longitudinal ligament was opened with a nerve hook, and then removed along with disc herniation and osteophytes, decompressing the spinal canal and thecal sac. We then continued to remove osteophytic overgrowth and disc material decompressing the neural foramina and exiting nerve roots bilaterally. So by both visualization and palpation we felt we had an adequate decompression of the neural elements. We then measured the height of the intravertebral disc space and selected a Peek interbody cage packed with morselized allograft. It was then gently positioned in the intravertebral disc space and countersunk.   The superior distraction pin was then removed and bone bleeding was stopped with bone wax. The distraction pin was inserted at the next inferior level. The annulus was incised and the disc space entered. Discectomy was performed with micro-curettes and pituitary and kerrison rongeurs. I then used the high-speed drill to drill the endplates down to the level of the posterior longitudinal ligament. The operating microscope was returned to the field.  The discectomy was continued posteriorly thru the disc space. Posterior longitudinal ligament was opened with a nerve hook, and then removed along with disc herniation and osteophytes, decompressing the spinal canal and thecal sac. We then continued to remove osteophytic overgrowth and disc material decompressing the neural foramina and exiting nerve roots bilaterally. So by both visualization and palpation we felt we had an adequate decompression of the neural elements. We then measured the height of the intravertebral disc space and selected a second Peek interbody cage which was packed with morselized allograft. It was then gently positioned in the intravertebral disc space and countersunk.   The superior distraction pin was then removed and bone bleeding was stopped with bone wax. The distraction pin was inserted at the next inferior  level. The annulus was  incised and the disc space entered. Discectomy was performed with micro-curettes and pituitary and kerrison rongeurs. I then used the high-speed drill to drill the endplates down to the level of the posterior longitudinal ligament. The operating microscope was returned to the field.  The discectomy was continued posteriorly thru the disc space. Posterior longitudinal ligament was opened with a nerve hook, and then removed along with disc herniation and osteophytes, decompressing the spinal canal and thecal sac. We then continued to remove osteophytic overgrowth and disc material decompressing the neural foramina and exiting nerve roots bilaterally. So by both visualization and palpation we felt we had an adequate decompression of the neural elements. We then measured the height of the intravertebral disc space and selected a third Peek interbody cage which was packed with morselized allograft. It was then gently positioned in the intravertebral disc space and countersunk.   The superior distraction pin was then again removed and bone bleeding was stopped with bone wax. The distraction pin was inserted at the most inferior level. The annulus was incised and the disc space entered. Discectomy was performed with micro-curettes and pituitary and kerrison rongeurs. I then used the high-speed drill to drill the endplates down to the level of the posterior longitudinal ligament. The operating microscope was returned to the field.  The discectomy was continued posteriorly thru the disc space. Posterior longitudinal ligament was opened with a nerve hook, and then removed along with disc herniation and osteophytes, decompressing the spinal canal and thecal sac. We then continued to remove osteophytic overgrowth and disc material decompressing the neural foramina and exiting nerve roots bilaterally. So by both visualization and palpation we felt we had an adequate decompression of the neural  elements. We then measured the height of the intravertebral disc space and selected a fourth Peek interbody cage which was packed with morselized. It was then gently positioned in the intravertebral disc space and countersunk.   I then inserted a titanium plate and placed eight variable angle screws into the four superior vertebral bodies and two fixed angle screws at the inferior level and locked them into position. The wound was irrigated with bacitracin solution, checked for hemostasis which was established and confirmed. Once meticulous hemostasis was achieved a JP drain was placed. The platysma was closed with interrupted 3-0 undyed Vicryl suture, the subcuticular layer was closed with running monocryl suture. The skin edges were approximated with dermabond. The drapes were removed. A sterile dressing was applied. The patient was then awakened from general anesthesia and transferred to the recovery room in stable condition. At the end of the procedure all sponge, needle and instrument counts were correct.   PATIENT DISPOSITION:  PACU   Delay start of Pharmacological VTE agent (>24hrs) due to surgical blood loss or risk of bleeding:  Yes

## 2016-07-12 NOTE — Anesthesia Preprocedure Evaluation (Addendum)
Anesthesia Evaluation  Patient identified by MRN, date of birth, ID band Patient awake    Reviewed: Allergy & Precautions, NPO status , Patient's Chart, lab work & pertinent test results  Airway Mallampati: II   Neck ROM: Full    Dental  (+) Teeth Intact, Dental Advisory Given   Pulmonary former smoker,    breath sounds clear to auscultation       Cardiovascular  Rhythm:Regular Rate:Normal     Neuro/Psych    GI/Hepatic   Endo/Other  diabetes  Renal/GU      Musculoskeletal   Abdominal   Peds  Hematology   Anesthesia Other Findings   Reproductive/Obstetrics                            Anesthesia Physical Anesthesia Plan  ASA: III  Anesthesia Plan: General   Post-op Pain Management:    Induction: Intravenous  Airway Management Planned: Oral ETT  Additional Equipment:   Intra-op Plan:   Post-operative Plan: Extubation in OR  Informed Consent: I have reviewed the patients History and Physical, chart, labs and discussed the procedure including the risks, benefits and alternatives for the proposed anesthesia with the patient or authorized representative who has indicated his/her understanding and acceptance.   Dental advisory given  Plan Discussed with: CRNA and Anesthesiologist  Anesthesia Plan Comments:         Anesthesia Quick Evaluation

## 2016-07-12 NOTE — H&P (Signed)
CC:  No chief complaint on file.   HPI: Hannah Macias is a 55 y.o. female with cervical spondylosis and radiculopathy.  She complains of neck pain radiating into both arms.  She presents for C3-4, 4-5, 5-6, and 6-7 ACDF.    PMH: Past Medical History:  Diagnosis Date  . ADHD (attention deficit hyperactivity disorder)   . Anxiety    takes Xanax at bedtime  . Arthritis   . Chronic constipation    Dulcolax nightly  . COPD (chronic obstructive pulmonary disease) (HCC)    Neb inhaler and ALbuterol inhaler  . Depression    takes Paxil daily  . Diabetes mellitus without complication (HCC)    takes metformin daily  . Diverticulitis   . History of blood transfusion    no abnormal reaction noted  . History of bronchitis 12/2015  . History of shingles   . Hyperlipidemia    takes Simvastatin daily  . Joint pain   . Lupus (HCC)    takes Plaquenil daily  . Migraines    takes Imitrex daily  . Pneumonia    hx of  . PONV (postoperative nausea and vomiting)   . Seasonal allergies    takes Claritin daily as needed.Flonase and Singulair at bedtime  . Seizures (HCC)    hx of-21 yrs ago.Was on Seizure meds but has been off > 5 yrs     PSH: Past Surgical History:  Procedure Laterality Date  . ABDOMINAL HYSTERECTOMY    . breast reduction with implants    . COLONOSCOPY    . PARTIAL COLECTOMY    . TONSILLECTOMY    . TUBAL LIGATION      SH: Social History  Substance Use Topics  . Smoking status: Former Games developer  . Smokeless tobacco: Not on file  . Alcohol use No    MEDS: Prior to Admission medications   Medication Sig Start Date End Date Taking? Authorizing Provider  acyclovir (ZOVIRAX) 200 MG capsule Take 200 mg by mouth 2 (two) times daily.   Yes Historical Provider, MD  albuterol (PROVENTIL HFA;VENTOLIN HFA) 108 (90 Base) MCG/ACT inhaler Inhale 2 puffs into the lungs every 6 (six) hours as needed for wheezing or shortness of breath.   Yes Historical Provider, MD  albuterol  (PROVENTIL) (2.5 MG/3ML) 0.083% nebulizer solution Take 2.5 mg by nebulization daily.   Yes Historical Provider, MD  ALPRAZolam Prudy Feeler) 1 MG tablet Take 1 mg by mouth at bedtime.   Yes Historical Provider, MD  carisoprodol (SOMA) 350 MG tablet Take 350 mg by mouth 2 (two) times daily.   Yes Historical Provider, MD  fluticasone (FLONASE) 50 MCG/ACT nasal spray Place 2 sprays into both nostrils at bedtime.   Yes Historical Provider, MD  hydroxychloroquine (PLAQUENIL) 200 MG tablet Take 200 mg by mouth 2 (two) times daily.   Yes Historical Provider, MD  loratadine (CLARITIN) 10 MG tablet Take 10 mg by mouth daily as needed for allergies.  07/05/16  Yes Historical Provider, MD  metFORMIN (GLUCOPHAGE) 500 MG tablet Take 500 mg by mouth 3 (three) times daily. Reported on 07/07/2016   Yes Historical Provider, MD  montelukast (SINGULAIR) 10 MG tablet Take 10 mg by mouth at bedtime.   Yes Historical Provider, MD  oxyCODONE-acetaminophen (PERCOCET/ROXICET) 5-325 MG tablet Take 1 tablet by mouth every 6 (six) hours as needed for moderate pain or severe pain.   Yes Historical Provider, MD  PARoxetine (PAXIL) 10 MG tablet Take 5 mg by mouth daily.   Yes Historical  Provider, MD  simvastatin (ZOCOR) 10 MG tablet Take 10 mg by mouth daily.   Yes Historical Provider, MD  SUMAtriptan (IMITREX) 25 MG tablet Take 25 mg by mouth every 2 (two) hours as needed for migraine. May repeat in 2 hours if headache persists or recurs.   Yes Historical Provider, MD  bisacodyl (DULCOLAX) 5 MG EC tablet Take 10 mg by mouth every evening.    Historical Provider, MD  zolpidem (AMBIEN) 10 MG tablet Take 10 mg by mouth at bedtime.    Historical Provider, MD    ALLERGY: Allergies  Allergen Reactions  . Morphine And Related Other (See Comments)    Can't breathe - Panic attack  . Latex Itching, Swelling and Rash  . Percocet [Oxycodone-Acetaminophen]     "speeds me up"    ROS: ROS  NEUROLOGIC EXAM: Awake, alert, oriented Memory  and concentration grossly intact Speech fluent, appropriate CN grossly intact Motor exam: Upper Extremities Deltoid Bicep Tricep Grip  Right 4/5 4/5 4/5 4/5  Left 4/5 4/5 4/5 4/5   Lower Extremity IP Quad PF DF EHL  Right 5/5 5/5 5/5 5/5 5/5  Left 5/5 5/5 5/5 5/5 5/5   Sensation grossly intact to LT  IMAGING: No new imaging  IMPRESSION: - 55 y.o. female with cervical spondylosis with radiculopathy.  PLAN: - C3-7 ACDF - Discussed risks and benefits and alternatives.  All questions answered.  Ready for OR.

## 2016-07-12 NOTE — Progress Notes (Signed)
Physical Therapy Evaluation Patient Details Name: Hannah Macias MRN: [id number] DOB: [birth year] Today's Date: 07/12/2016   History of Present Illness  pt is a 55 y/o female with h/o ADHD, migraines, Lupus, COPD, admitted with c/o radicular pain bil UE's, s/p C3-C7 ACDF.  Clinical Impression  Pt is close to baseline functioning and should be safe at home with available friends lined up.  All education with completed. There are no further acute PT needs.  Will sign off at this time.     Follow Up Recommendations No PT follow up    Equipment Recommendations  None recommended by PT    Recommendations for Other Services       Precautions / Restrictions Precautions Precautions: Fall      Mobility  Bed Mobility Overal bed mobility: Needs Assistance Bed Mobility: Sidelying to Sit;Sit to Sidelying   Sidelying to sit: Supervision     Sit to sidelying: Supervision General bed mobility comments: reinforced correct technique,   Transfers Overall transfer level: Needs assistance   Transfers: Sit to/from Stand Sit to Stand: Supervision         General transfer comment: reinforced safe transfer technque  Ambulation/Gait Ambulation/Gait assistance: Min assist Ambulation Distance (Feet): 130 Feet Assistive device: 1 person hand held assist Gait Pattern/deviations: Step-through pattern Gait velocity: slower Gait velocity interpretation: Below normal speed for age/gender General Gait Details: mild unsteadiness  guarded  Stairs Stairs: Yes Stairs assistance: Supervision Stair Management: One rail Right;Alternating pattern;Forwards Number of Stairs: 12 General stair comments: generally steady  Wheelchair Mobility    Modified Rankin (Stroke Patients Only)       Balance                                             Pertinent Vitals/Pain Pain Assessment: Faces Faces Pain Scale: Hurts even more Pain Location: neck Pain Descriptors / Indicators:  Sore Pain Intervention(s): Monitored during session;Repositioned;Ice applied    Home Living Family/patient expects to be discharged to:: Private residence Living Arrangements: Alone Available Help at Discharge: Available PRN/intermittently;Friend(s) Type of Home: House       Home Layout: One level Home Equipment: None      Prior Function Level of Independence: Independent               Hand Dominance        Extremity/Trunk Assessment   Upper Extremity Assessment: Defer to OT evaluation           Lower Extremity Assessment: Overall WFL for tasks assessed         Communication   Communication: No difficulties  Cognition Arousal/Alertness: Awake/alert Behavior During Therapy: Anxious Overall Cognitive Status: Within Functional Limits for tasks assessed                      General Comments General comments (skin integrity, edema, etc.): instructed in back care/prec, logroll/transition to/from sit, lifting restrictions, progression of activity, bracing issues.    Exercises        Assessment/Plan    PT Assessment Patent does not need any further PT services  PT Diagnosis Difficulty walking;Acute pain   PT Problem List Decreased activity tolerance;Decreased balance;Decreased knowledge of precautions  PT Treatment Interventions     PT Goals (Current goals can be found in the Care Plan section) Acute Rehab PT Goals PT Goal Formulation: All assessment and education complete,  DC therapy Potential to Achieve Goals: Good    Frequency     Barriers to discharge        Co-evaluation               End of Session   Activity Tolerance: Patient tolerated treatment well Patient left: in bed;with call bell/phone within reach;with SCD's reapplied Nurse Communication: Mobility status    Functional Assessment Tool Used: clinical judgement Functional Limitation: Mobility: Walking and moving around Mobility: Walking and Moving Around Current  Status (M5465): At least 1 percent but less than 20 percent impaired, limited or restricted Mobility: Walking and Moving Around Goal Status [phone]): At least 1 percent but less than 20 percent impaired, limited or restricted Mobility: Walking and Moving Around Discharge Status [phone]): At least 1 percent but less than 20 percent impaired, limited or restricted    Time: 7517-0017 PT Time Calculation (min) (ACUTE ONLY): 26 min   Charges:   PT Evaluation $PT Eval Low Complexity: 1 Procedure PT Treatments $Gait Training: 8-22 mins   PT G Codes:   PT G-Codes **NOT FOR INPATIENT CLASS** Functional Assessment Tool Used: clinical judgement Functional Limitation: Mobility: Walking and moving around Mobility: Walking and Moving Around Current Status (C9449): At least 1 percent but less than 20 percent impaired, limited or restricted Mobility: Walking and Moving Around Goal Status [phone]): At least 1 percent but less than 20 percent impaired, limited or restricted Mobility: Walking and Moving Around Discharge Status [phone]): At least 1 percent but less than 20 percent impaired, limited or restricted    Genea Rheaume, Eliseo Gum 07/12/2016, 5:32 PM  07/12/2016  Randall Bing, PT [phone]  (pager)

## 2016-07-13 ENCOUNTER — Encounter (HOSPITAL_COMMUNITY): Payer: Self-pay | Admitting: Neurological Surgery

## 2016-07-13 DIAGNOSIS — M4722 Other spondylosis with radiculopathy, cervical region: Secondary | ICD-10-CM | POA: Diagnosis not present

## 2016-07-13 LAB — GLUCOSE, CAPILLARY: Glucose-Capillary: 161 mg/dL — ABNORMAL HIGH (ref 65–99)

## 2016-07-13 MED ORDER — CARISOPRODOL 350 MG PO TABS: 350.0000 mg | ORAL_TABLET | Freq: Four times a day (QID) | ORAL | 0 refills | Status: DC

## 2016-07-13 MED ORDER — TAMSULOSIN HCL 0.4 MG PO CAPS
0.4000 mg | ORAL_CAPSULE | Freq: Every day | ORAL | Status: DC
  Administered 2016-07-13: 0.4 mg via ORAL
  Filled 2016-07-13 (×2): qty 1

## 2016-07-13 MED ORDER — HYDROMORPHONE HCL 2 MG PO TABS: 2.0000 mg | ORAL_TABLET | Freq: Four times a day (QID) | ORAL | 0 refills | Status: DC | PRN

## 2016-07-13 NOTE — Progress Notes (Signed)
Pt. discharged home accompanied by friend. Prescriptions and discharge instructions given with verbalization of understanding. Incision site on neck with no s/s of infection - no swelling, redness, bleeding, and/or drainage noted. Aspen cervical collar intact. Pain med given just before leaving. Opportunity given to ask questions but no question asked. Pt. transported out of this unit in wheelchair by the nurse tech

## 2016-07-13 NOTE — Evaluation (Signed)
Occupational Therapy Evaluation Patient Details Name: Hannah Macias MRN: [id number] DOB: 19-Aug-1961 Today's Date: 07/13/2016    History of Present Illness pt is a 55 y/o female with h/o ADHD, migraines, Lupus, COPD, admitted with c/o radicular pain bil UE's, s/p C3-C7 ACDF.   Clinical Impression   Patient evaluated by Occupational Therapy with no further acute OT needs identified. All education has been completed and the patient has no further questions. See below for any follow-up Occupational Therapy or equipment needs. OT to sign off. Thank you for referral.      Follow Up Recommendations  No OT follow up    Equipment Recommendations  None recommended by OT    Recommendations for Other Services       Precautions / Restrictions Precautions Precautions: Fall Precaution Comments: handout provided for cervical precautions and reviewed      Mobility Bed Mobility               General bed mobility comments: sitting eob   Transfers Overall transfer level: Modified independent               General transfer comment: observed in hallway on arrival to unit and then transfer to bed     Balance                                            ADL Overall ADL's : At baseline                                       General ADL Comments: Educauted patient on cervical precautions several times and had patient verbalize them back. pt very quick in movements and restless. pt states "i have been doing my own dressing/ bathing this morning. I wore this brace before." pt verbalizes but return demo requires reinforcement. Pt reports having a rough evening and the medications making her loopy. Pt continues to be very restless this morning as well.      Vision     Perception     Praxis      Pertinent Vitals/Pain Pain Assessment: No/denies pain     Hand Dominance Right   Extremity/Trunk Assessment Upper Extremity Assessment Upper  Extremity Assessment: Overall WFL for tasks assessed (reports R Ue pain completely gone)   Lower Extremity Assessment Lower Extremity Assessment: Overall WFL for tasks assessed   Cervical / Trunk Assessment Cervical / Trunk Assessment: Other exceptions (s/p surg and previous surg)   Communication Communication Communication: No difficulties   Cognition Arousal/Alertness: Awake/alert Behavior During Therapy: Restless Overall Cognitive Status: Within Functional Limits for tasks assessed                     General Comments       Exercises       Shoulder Instructions      Home Living Family/patient expects to be discharged to:: Private residence Living Arrangements: Alone Available Help at Discharge: Available PRN/intermittently;Friend(s) Type of Home: House       Home Layout: One level     Bathroom Shower/Tub: Producer, television/film/video: Standard     Home Equipment: None   Additional Comments: pt has x2 dogs, reports mother was on her way to the beach and she called her to turn around and  come pick her up from hospital. pt has x2 dogs and will have neighbors for (A). Pt does not have a set plan for caregivers upon assistance. pt reports mother will return sunday but will be minimal assistance      Prior Functioning/Environment Level of Independence: Independent             OT Diagnosis:     OT Problem List:     OT Treatment/Interventions:      OT Goals(Current goals can be found in the care plan section)    OT Frequency:     Barriers to D/C:            Co-evaluation              End of Session Equipment Utilized During Treatment: Cervical collar Nurse Communication: Mobility status;Precautions  Activity Tolerance: Patient tolerated treatment well Patient left: in bed;with call bell/phone within reach   Time: 0837-0849 OT Time Calculation (min): 12 min Charges:  OT General Charges $OT Visit: 1 Procedure OT Evaluation $OT  Eval Moderate Complexity: 1 Procedure G-Codes: OT G-codes **NOT FOR INPATIENT CLASS** Functional Assessment Tool Used: clinical judgement Functional Limitation: Self care Self Care Current Status (Y8657): 0 percent impaired, limited or restricted Self Care Goal Status (Q4696): 0 percent impaired, limited or restricted Self Care Discharge Status (E9528): 0 percent impaired, limited or restricted  Boone Master B 07/13/2016, 9:52 AM    Mateo Flow   OTR/L Pager: [phone] Office: [phone] .

## 2016-07-13 NOTE — Discharge Summary (Signed)
Date of Admission: 07/12/2016  Date of Discharge: 07/13/16  PRE-OPERATIVE DIAGNOSIS:  Cervical spondylosis with radiculopathy  POST-OPERATIVE DIAGNOSIS:  Same  PROCEDURE:  C3-7 anterior discectomy with fusion and plate fixation; use of PEEK spacers  Attending: Loura Halt Sharyon Peitz, MD  Hospital Course:  The patient was admitted for the above listed operation and had an uncomplicated post-operative course.  They were discharged in stable condition.  Follow up: 3 weeks    Medication List    TAKE these medications   acyclovir 200 MG capsule Commonly known as:  ZOVIRAX Take 200 mg by mouth 2 (two) times daily.   albuterol 108 (90 Base) MCG/ACT inhaler Commonly known as:  PROVENTIL HFA;VENTOLIN HFA Inhale 2 puffs into the lungs every 6 (six) hours as needed for wheezing or shortness of breath.   albuterol (2.5 MG/3ML) 0.083% nebulizer solution Commonly known as:  PROVENTIL Take 2.5 mg by nebulization daily.   ALPRAZolam 1 MG tablet Commonly known as:  XANAX Take 1 mg by mouth at bedtime.   bisacodyl 5 MG EC tablet Commonly known as:  DULCOLAX Take 10 mg by mouth every evening.   carisoprodol 350 MG tablet Commonly known as:  SOMA Take 350 mg by mouth 2 (two) times daily. What changed:  Another medication with the same name was added. Make sure you understand how and when to take each.   carisoprodol 350 MG tablet Commonly known as:  SOMA Take 1 tablet (350 mg total) by mouth 4 (four) times daily. What changed:  You were already taking a medication with the same name, and this prescription was added. Make sure you understand how and when to take each.   fluticasone 50 MCG/ACT nasal spray Commonly known as:  FLONASE Place 2 sprays into both nostrils at bedtime.   HYDROmorphone 2 MG tablet Commonly known as:  DILAUDID Take 1 tablet (2 mg total) by mouth every 6 (six) hours as needed for severe pain.   hydroxychloroquine 200 MG tablet Commonly known as:   PLAQUENIL Take 200 mg by mouth 2 (two) times daily.   loratadine 10 MG tablet Commonly known as:  CLARITIN Take 10 mg by mouth daily as needed for allergies.   metFORMIN 500 MG tablet Commonly known as:  GLUCOPHAGE Take 500 mg by mouth 3 (three) times daily. Reported on 07/07/2016   montelukast 10 MG tablet Commonly known as:  SINGULAIR Take 10 mg by mouth at bedtime.   oxyCODONE-acetaminophen 5-325 MG tablet Commonly known as:  PERCOCET/ROXICET Take 1 tablet by mouth every 6 (six) hours as needed for moderate pain or severe pain.   PARoxetine 10 MG tablet Commonly known as:  PAXIL Take 5 mg by mouth daily.   simvastatin 10 MG tablet Commonly known as:  ZOCOR Take 10 mg by mouth daily.   SUMAtriptan 25 MG tablet Commonly known as:  IMITREX Take 25 mg by mouth every 2 (two) hours as needed for migraine. May repeat in 2 hours if headache persists or recurs.   zolpidem 10 MG tablet Commonly known as:  AMBIEN Take 10 mg by mouth at bedtime.

## 2016-09-14 DIAGNOSIS — K5909 Other constipation: Secondary | ICD-10-CM | POA: Insufficient documentation

## 2016-09-14 DIAGNOSIS — F419 Anxiety disorder, unspecified: Secondary | ICD-10-CM | POA: Insufficient documentation

## 2016-09-14 DIAGNOSIS — E785 Hyperlipidemia, unspecified: Secondary | ICD-10-CM | POA: Insufficient documentation

## 2016-09-14 DIAGNOSIS — K529 Noninfective gastroenteritis and colitis, unspecified: Secondary | ICD-10-CM | POA: Insufficient documentation

## 2016-09-14 DIAGNOSIS — M797 Fibromyalgia: Secondary | ICD-10-CM | POA: Insufficient documentation

## 2016-09-14 DIAGNOSIS — J449 Chronic obstructive pulmonary disease, unspecified: Secondary | ICD-10-CM | POA: Insufficient documentation

## 2016-09-14 DIAGNOSIS — G43909 Migraine, unspecified, not intractable, without status migrainosus: Secondary | ICD-10-CM | POA: Insufficient documentation

## 2016-09-14 DIAGNOSIS — E119 Type 2 diabetes mellitus without complications: Secondary | ICD-10-CM | POA: Insufficient documentation

## 2016-09-23 DIAGNOSIS — M329 Systemic lupus erythematosus, unspecified: Secondary | ICD-10-CM | POA: Insufficient documentation

## 2017-08-16 DIAGNOSIS — N390 Urinary tract infection, site not specified: Secondary | ICD-10-CM | POA: Insufficient documentation

## 2017-09-06 DIAGNOSIS — I83813 Varicose veins of bilateral lower extremities with pain: Secondary | ICD-10-CM | POA: Insufficient documentation

## 2017-10-10 ENCOUNTER — Ambulatory Visit (INDEPENDENT_AMBULATORY_CARE_PROVIDER_SITE_OTHER): Payer: Medicaid Other | Admitting: Orthopaedic Surgery

## 2017-10-10 ENCOUNTER — Encounter (INDEPENDENT_AMBULATORY_CARE_PROVIDER_SITE_OTHER): Payer: Self-pay | Admitting: Orthopaedic Surgery

## 2017-10-10 ENCOUNTER — Ambulatory Visit (INDEPENDENT_AMBULATORY_CARE_PROVIDER_SITE_OTHER): Payer: Medicaid Other

## 2017-10-10 DIAGNOSIS — M25552 Pain in left hip: Secondary | ICD-10-CM

## 2017-10-10 DIAGNOSIS — M1612 Unilateral primary osteoarthritis, left hip: Secondary | ICD-10-CM

## 2017-10-10 NOTE — Progress Notes (Signed)
Office Visit Note   Patient: Hannah Macias           Date of Birth: Dec 08, 1961           MRN: [id number] Visit Date: 10/10/2017              Requested by: Shelle Iron, MD [address] [place], [place] [address] PCP: Shelle Iron, MD   Assessment & Plan: Visit Diagnoses:  1. Pain in left hip   2. Primary osteoarthritis of left hip     Plan: Impression is mild hip osteoarthritis  and fat atrophy from previous cortisone injections.  This was discussed with the patient the side effects of the cortisone injection.  We will get her set up with an intra-articular steroid injection of the left hip with Dr. Alvester Morin.  Questions encouraged and answered.  Follow-up as needed.  I did discuss with her that I would not expect the same fat atrophy with intra-articular steroid injections  Follow-Up Instructions: Return if symptoms worsen or fail to improve.   Orders:  Orders Placed This Encounter  Procedures  . XR HIP UNILAT W OR W/O PELVIS 2-3 VIEWS LEFT  . Ambulatory referral to Physical Medicine Rehab   No orders of the defined types were placed in this encounter.     Procedures: No procedures performed   Clinical Data: No additional findings.   Subjective: Chief Complaint  Patient presents with  . Left Hip - Pain    Patient is a 56 year old female with a history of lupus who has had multiple injections of her left trochanteric bursa who has noticed a progressively worsening indention over the left hip region.  She is also complaining of hip and groin pain that is worse with activity and with certain movements.  She denies any numbness and tingling.  She denies any back pain today.    Review of Systems  Constitutional: Negative.   HENT: Negative.   Eyes: Negative.   Respiratory: Negative.   Cardiovascular: Negative.   Endocrine: Negative.   Musculoskeletal: Negative.   Neurological: Negative.   Hematological: Negative.   Psychiatric/Behavioral: Negative.   All other  systems reviewed and are negative.    Objective: Vital Signs: There were no vitals taken for this visit.  Physical Exam  Constitutional: She is oriented to person, place, and time. She appears well-developed and well-nourished.  HENT:  Head: Normocephalic and atraumatic.  Eyes: EOM are normal.  Neck: Neck supple.  Pulmonary/Chest: Effort normal.  Abdominal: Soft.  Neurological: She is alert and oriented to person, place, and time.  Skin: Skin is warm. Capillary refill takes less than 2 seconds.  Psychiatric: She has a normal mood and affect. Her behavior is normal. Judgment and thought content normal.  Nursing note and vitals reviewed.   Ortho Exam Left hip exam shows pain with external rotation and positive Stinchfield sign.  Lateral hip is mildly tender.  She does have an obvious indention on the lateral aspect of the hip that is sunken in there is attached to the subcutaneous tissue.  There is no signs of infection. Specialty Comments:  No specialty comments available.  Imaging: Xr Hip Unilat W Or W/o Pelvis 2-3 Views Left  Result Date: 10/10/2017 Mild joint space narrowing of the left hip    PMFS History: Patient Active Problem List   Diagnosis Date Noted  . Cervical spondylosis with radiculopathy 07/12/2016   Past Medical History:  Diagnosis Date  . ADHD (attention deficit hyperactivity disorder)   .  Anxiety    takes Xanax at bedtime  . Arthritis   . Chronic constipation    Dulcolax nightly  . COPD (chronic obstructive pulmonary disease) (HCC)    Neb inhaler and ALbuterol inhaler  . Depression    takes Paxil daily  . Diabetes mellitus without complication (HCC)    takes metformin daily  . Diverticulitis   . History of blood transfusion    no abnormal reaction noted  . History of bronchitis 12/2015  . History of shingles   . Hyperlipidemia    takes Simvastatin daily  . Joint pain   . Lupus    takes Plaquenil daily  . Migraines    takes Imitrex daily    . Pneumonia    hx of  . PONV (postoperative nausea and vomiting)   . Seasonal allergies    takes Claritin daily as needed.Flonase and Singulair at bedtime  . Seizures (HCC)    hx of-21 yrs ago.Was on Seizure meds but has been off > 5 yrs     No family history on file.  Past Surgical History:  Procedure Laterality Date  . ABDOMINAL HYSTERECTOMY    . ANTERIOR CERVICAL DECOMPRESSION/DISCECTOMY FUSION 4 LEVELS N/A 07/12/2016   Procedure: CERVICAL THREE-FOUR CERVICAL FOUR-FIVE CERVICAL FIVE-SIX CERVICAL SIX-SEVEN  ANTERIOR CERVICAL DECOMPRESSION DISKECTOMY FUSION;  Surgeon: Loura HaltBenjamin Jared Ditty, MD;  Location: [facility];  Service: Neurosurgery;  Laterality: N/A;  . breast reduction with implants    . COLONOSCOPY    . PARTIAL COLECTOMY    . TONSILLECTOMY    . TUBAL LIGATION     Social History   Occupational History  . Not on file.   Social History Main Topics  . Smoking status: Former Games developermoker  . Smokeless tobacco: Never Used  . Alcohol use No  . Drug use: No  . Sexual activity: Not on file

## 2017-10-25 ENCOUNTER — Ambulatory Visit (INDEPENDENT_AMBULATORY_CARE_PROVIDER_SITE_OTHER): Payer: Self-pay

## 2017-10-25 ENCOUNTER — Ambulatory Visit (INDEPENDENT_AMBULATORY_CARE_PROVIDER_SITE_OTHER): Payer: Medicaid Other | Admitting: Physical Medicine and Rehabilitation

## 2017-10-25 ENCOUNTER — Encounter (INDEPENDENT_AMBULATORY_CARE_PROVIDER_SITE_OTHER): Payer: Self-pay | Admitting: Physical Medicine and Rehabilitation

## 2017-10-25 DIAGNOSIS — M25552 Pain in left hip: Secondary | ICD-10-CM | POA: Diagnosis not present

## 2017-10-25 NOTE — Patient Instructions (Signed)

## 2017-10-25 NOTE — Progress Notes (Signed)
Hannah SjogrenJoanne Macias - 56 y.o. female MRN [id number]  Date of birth: 1961-01-20  Office Visit Note: Visit Date: 10/25/2017 PCP: Johny DrillingUppal, Dushant, MD Referred by: Shelle IronSistasis, Jim, MD  Subjective: Chief Complaint  Patient presents with  . Left Hip - Pain   HPI: Hannah Macias is a 56 year old female followed by Dr. Roda ShuttersXu.  She has left hip and anterior lateral hip pain.  Worse with movement.  She has had multiple greater trochanteric injections without much relief.  We will complete a diagnostic and hopefully therapeutic anesthetic hip arthrogram.    ROS Otherwise per HPI.  Assessment & Plan: Visit Diagnoses:  1. Pain in left hip     Plan: Findings:  Diagnostic and hopefully therapeutic anesthetic hip arthrogram.  Patient did get relief in the anesthetic phase.    Meds & Orders: No orders of the defined types were placed in this encounter.   Orders Placed This Encounter  Procedures  . Large Joint Inj: L hip joint  . XR C-ARM NO REPORT    Follow-up: Return if symptoms worsen or fail to improve, for Dr. Roda ShuttersXu.   Procedures: Large Joint Inj: L hip joint on 10/25/2017 3:06 PM Indications: pain and diagnostic evaluation Details: 22 G needle, anterior approach  Arthrogram: Yes  Medications: 80 mg triamcinolone acetonide 40 MG/ML; 3 mL bupivacaine 0.5 % Outcome: tolerated well, no immediate complications  Arthrogram demonstrated excellent flow of contrast throughout the joint surface without extravasation or obvious defect.  The patient had relief of symptoms during the anesthetic phase of the injection.  Procedure, treatment alternatives, risks and benefits explained, specific risks discussed. Consent was given by the patient. Immediately prior to procedure a time out was called to verify the correct patient, procedure, equipment, support staff and site/side marked as required. Patient was prepped and draped in the usual sterile fashion.      No notes on file   Clinical History: No specialty  comments available.  She reports that she has quit smoking. she has never used smokeless tobacco. No results for input(s): HGBA1C, LABURIC in the last 8760 hours.  Objective:  VS:  HT:    WT:   BMI:     BP:   HR: bpm  TEMP: ( )  RESP:  Physical Exam  Ortho Exam Imaging: No results found.  Past Medical/Family/Surgical/Social History: Medications & Allergies reviewed per EMR Patient Active Problem List   Diagnosis Date Noted  . Cervical spondylosis with radiculopathy 07/12/2016   Past Medical History:  Diagnosis Date  . ADHD (attention deficit hyperactivity disorder)   . Anxiety    takes Xanax at bedtime  . Arthritis   . Chronic constipation    Dulcolax nightly  . COPD (chronic obstructive pulmonary disease) (HCC)    Neb inhaler and ALbuterol inhaler  . Depression    takes Paxil daily  . Diabetes mellitus without complication (HCC)    takes metformin daily  . Diverticulitis   . History of blood transfusion    no abnormal reaction noted  . History of bronchitis 12/2015  . History of shingles   . Hyperlipidemia    takes Simvastatin daily  . Joint pain   . Lupus    takes Plaquenil daily  . Migraines    takes Imitrex daily  . Pneumonia    hx of  . PONV (postoperative nausea and vomiting)   . Seasonal allergies    takes Claritin daily as needed.Flonase and Singulair at bedtime  . Seizures (HCC)  hx of-21 yrs ago.Was on Seizure meds but has been off > 5 yrs    No family history on file. Past Surgical History:  Procedure Laterality Date  . ABDOMINAL HYSTERECTOMY    . breast reduction with implants    . COLONOSCOPY    . PARTIAL COLECTOMY    . TONSILLECTOMY    . TUBAL LIGATION     Social History   Occupational History  . Not on file  Tobacco Use  . Smoking status: Former Games developermoker  . Smokeless tobacco: Never Used  Substance and Sexual Activity  . Alcohol use: No  . Drug use: No  . Sexual activity: Not on file

## 2017-10-30 MED ORDER — BUPIVACAINE HCL 0.5 % IJ SOLN
3.0000 mL | INTRAMUSCULAR | Status: AC | PRN
  Administered 2017-10-25: 3 mL via INTRA_ARTICULAR

## 2017-10-30 MED ORDER — TRIAMCINOLONE ACETONIDE 40 MG/ML IJ SUSP
80.0000 mg | INTRAMUSCULAR | Status: AC | PRN
  Administered 2017-10-25: 80 mg via INTRA_ARTICULAR

## 2018-01-25 ENCOUNTER — Telehealth (INDEPENDENT_AMBULATORY_CARE_PROVIDER_SITE_OTHER): Payer: Self-pay | Admitting: Orthopaedic Surgery

## 2018-01-25 NOTE — Telephone Encounter (Signed)
See message below °

## 2018-01-25 NOTE — Telephone Encounter (Signed)
Patient called advised she was never scheduled for her MRI. Patient advised the indentation is 5 times the size as before. Patient asked for a call back as soon as possible. Patient advised she is getting an MRI with another doctor. Patient advised she will bring the MRI when she come back to see Dr. Roda ShuttersXu. The number to contact patient is [phone]

## 2018-02-16 DIAGNOSIS — I872 Venous insufficiency (chronic) (peripheral): Secondary | ICD-10-CM | POA: Insufficient documentation

## 2018-02-20 ENCOUNTER — Other Ambulatory Visit (INDEPENDENT_AMBULATORY_CARE_PROVIDER_SITE_OTHER): Payer: Self-pay

## 2018-02-20 ENCOUNTER — Ambulatory Visit (INDEPENDENT_AMBULATORY_CARE_PROVIDER_SITE_OTHER): Payer: Medicaid Other | Admitting: Orthopaedic Surgery

## 2018-02-20 ENCOUNTER — Encounter (INDEPENDENT_AMBULATORY_CARE_PROVIDER_SITE_OTHER): Payer: Self-pay | Admitting: Orthopaedic Surgery

## 2018-02-20 DIAGNOSIS — M1612 Unilateral primary osteoarthritis, left hip: Secondary | ICD-10-CM

## 2018-02-20 DIAGNOSIS — M25552 Pain in left hip: Secondary | ICD-10-CM

## 2018-02-20 NOTE — Progress Notes (Signed)
Office Visit Note   Patient: Seriah Brotzman           Date of Birth: 23-Jun-1961           MRN: [id number] Visit Date: 02/20/2018              Requested by: Johny Drilling, MD [address] [place], [place] [address] PCP: Johny Drilling, MD   Assessment & Plan: Visit Diagnoses:  1. Primary osteoarthritis of left hip     Plan: MRI findings are consistent with mild left hip degenerative joint disease.  I reviewed the MRI results which do not show any evidence of abductor tendinosis or pathology.  Her indentation is very classic and consistent with fat atrophy from previous cortisone injections.  She is convinced that her abductor muscles are wasting away and is demanding a muscle biopsy.  I am unable to reassure her that it is just her hip joint arthritis therefore I will send her to a neurologist for consideration of evaluation of muscle biopsy if indicated.  Follow-up as needed. Total face to face encounter time was greater than 25 minutes and over half of this time was spent in counseling and/or coordination of care.  Follow-Up Instructions: Return if symptoms worsen or fail to improve.   Orders:  No orders of the defined types were placed in this encounter.  No orders of the defined types were placed in this encounter.     Procedures: No procedures performed   Clinical Data: No additional findings.   Subjective: Chief Complaint  Patient presents with  . Left Hip - Pain    Patient follows up today for continued left hip pain.  She recently had a left hip MRI that was performed 01/26/2018.  She states that she has had partial relief from the cortisone injection with Dr. Alvester Morin.  She is complaining that her indentation on the lateral aspect of her left hip is getting bigger.  She complains of stiffness and pain in the groin and hip region.    Review of Systems  Constitutional: Negative.   HENT: Negative.   Eyes: Negative.   Respiratory: Negative.   Cardiovascular:  Negative.   Endocrine: Negative.   Musculoskeletal: Negative.   Neurological: Negative.   Hematological: Negative.   Psychiatric/Behavioral: Negative.   All other systems reviewed and are negative.    Objective: Vital Signs: There were no vitals taken for this visit.  Physical Exam  Constitutional: She is oriented to person, place, and time. She appears well-developed and well-nourished.  Pulmonary/Chest: Effort normal.  Neurological: She is alert and oriented to person, place, and time.  Skin: Skin is warm. Capillary refill takes less than 2 seconds.  Psychiatric: She has a normal mood and affect. Her behavior is normal. Judgment and thought content normal.  Nursing note and vitals reviewed.   Ortho Exam Left hip exam is consistent with hip arthritis.  Pain with rotation and positive Stinchfield sign.  Indentation and lateral trochanteric bursa are nontender.  Her abductor muscle function is completely normal. Specialty Comments:  No specialty comments available.  Imaging: No results found.   PMFS History: Patient Active Problem List   Diagnosis Date Noted  . Cervical spondylosis with radiculopathy 07/12/2016   Past Medical History:  Diagnosis Date  . ADHD (attention deficit hyperactivity disorder)   . Anxiety    takes Xanax at bedtime  . Arthritis   . Chronic constipation    Dulcolax nightly  . COPD (chronic obstructive pulmonary disease) (HCC)  Neb inhaler and ALbuterol inhaler  . Depression    takes Paxil daily  . Diabetes mellitus without complication (HCC)    takes metformin daily  . Diverticulitis   . History of blood transfusion    no abnormal reaction noted  . History of bronchitis 12/2015  . History of shingles   . Hyperlipidemia    takes Simvastatin daily  . Joint pain   . Lupus    takes Plaquenil daily  . Migraines    takes Imitrex daily  . Pneumonia    hx of  . PONV (postoperative nausea and vomiting)   . Seasonal allergies    takes  Claritin daily as needed.Flonase and Singulair at bedtime  . Seizures (HCC)    hx of-21 yrs ago.Was on Seizure meds but has been off > 5 yrs     History reviewed. No pertinent family history.  Past Surgical History:  Procedure Laterality Date  . ABDOMINAL HYSTERECTOMY    . ANTERIOR CERVICAL DECOMPRESSION/DISCECTOMY FUSION 4 LEVELS N/A 07/12/2016   Procedure: CERVICAL THREE-FOUR CERVICAL FOUR-FIVE CERVICAL FIVE-SIX CERVICAL SIX-SEVEN  ANTERIOR CERVICAL DECOMPRESSION DISKECTOMY FUSION;  Surgeon: Loura HaltBenjamin Jared Ditty, MD;  Location: [facility];  Service: Neurosurgery;  Laterality: N/A;  . breast reduction with implants    . COLONOSCOPY    . PARTIAL COLECTOMY    . TONSILLECTOMY    . TUBAL LIGATION     Social History   Occupational History  . Not on file  Tobacco Use  . Smoking status: Former Games developermoker  . Smokeless tobacco: Never Used  Substance and Sexual Activity  . Alcohol use: No  . Drug use: No  . Sexual activity: Not on file

## 2018-02-21 ENCOUNTER — Encounter: Payer: Self-pay | Admitting: Neurology

## 2018-04-13 ENCOUNTER — Encounter: Payer: Self-pay | Admitting: Gastroenterology

## 2018-04-26 ENCOUNTER — Ambulatory Visit: Payer: Self-pay | Admitting: Neurology

## 2018-04-26 ENCOUNTER — Encounter

## 2019-02-22 DIAGNOSIS — R5381 Other malaise: Secondary | ICD-10-CM | POA: Insufficient documentation

## 2019-05-18 DIAGNOSIS — F330 Major depressive disorder, recurrent, mild: Secondary | ICD-10-CM | POA: Insufficient documentation

## 2019-05-18 DIAGNOSIS — F900 Attention-deficit hyperactivity disorder, predominantly inattentive type: Secondary | ICD-10-CM | POA: Insufficient documentation

## 2020-03-06 DIAGNOSIS — F411 Generalized anxiety disorder: Secondary | ICD-10-CM | POA: Insufficient documentation

## 2020-04-14 ENCOUNTER — Other Ambulatory Visit: Payer: Self-pay

## 2020-04-14 ENCOUNTER — Encounter (HOSPITAL_COMMUNITY): Payer: Self-pay

## 2020-04-14 DIAGNOSIS — K660 Peritoneal adhesions (postprocedural) (postinfection): Principal | ICD-10-CM | POA: Diagnosis present

## 2020-04-14 DIAGNOSIS — G43909 Migraine, unspecified, not intractable, without status migrainosus: Secondary | ICD-10-CM | POA: Diagnosis present

## 2020-04-14 DIAGNOSIS — F419 Anxiety disorder, unspecified: Secondary | ICD-10-CM | POA: Diagnosis present

## 2020-04-14 DIAGNOSIS — Z91040 Latex allergy status: Secondary | ICD-10-CM

## 2020-04-14 DIAGNOSIS — Z79899 Other long term (current) drug therapy: Secondary | ICD-10-CM

## 2020-04-14 DIAGNOSIS — E785 Hyperlipidemia, unspecified: Secondary | ICD-10-CM | POA: Diagnosis present

## 2020-04-14 DIAGNOSIS — Z20822 Contact with and (suspected) exposure to covid-19: Secondary | ICD-10-CM | POA: Diagnosis present

## 2020-04-14 DIAGNOSIS — Z7984 Long term (current) use of oral hypoglycemic drugs: Secondary | ICD-10-CM

## 2020-04-14 DIAGNOSIS — M797 Fibromyalgia: Secondary | ICD-10-CM | POA: Diagnosis present

## 2020-04-14 DIAGNOSIS — E119 Type 2 diabetes mellitus without complications: Secondary | ICD-10-CM | POA: Diagnosis present

## 2020-04-14 DIAGNOSIS — Z981 Arthrodesis status: Secondary | ICD-10-CM

## 2020-04-14 DIAGNOSIS — F909 Attention-deficit hyperactivity disorder, unspecified type: Secondary | ICD-10-CM | POA: Diagnosis present

## 2020-04-14 DIAGNOSIS — K5909 Other constipation: Secondary | ICD-10-CM | POA: Diagnosis present

## 2020-04-14 DIAGNOSIS — J439 Emphysema, unspecified: Secondary | ICD-10-CM | POA: Diagnosis present

## 2020-04-14 DIAGNOSIS — Z87891 Personal history of nicotine dependence: Secondary | ICD-10-CM

## 2020-04-14 DIAGNOSIS — M1990 Unspecified osteoarthritis, unspecified site: Secondary | ICD-10-CM | POA: Diagnosis present

## 2020-04-14 DIAGNOSIS — F329 Major depressive disorder, single episode, unspecified: Secondary | ICD-10-CM | POA: Diagnosis present

## 2020-04-14 DIAGNOSIS — Z885 Allergy status to narcotic agent status: Secondary | ICD-10-CM

## 2020-04-14 MED ORDER — SODIUM CHLORIDE 0.9% FLUSH: 3.0000 mL | Freq: Once | INTRAVENOUS | Status: DC

## 2020-04-14 NOTE — ED Triage Notes (Signed)
Patient arrived stating she was seen in [facility] ER yesterday and told she had intussusception and needed surgery. Patient has complaints of left sided abdominal pain, chills, and dizziness. Reports wanting surgery at [facility] so she left the other hospital.

## 2020-04-15 ENCOUNTER — Emergency Department (HOSPITAL_COMMUNITY): Payer: Medicaid Other

## 2020-04-15 ENCOUNTER — Inpatient Hospital Stay (HOSPITAL_COMMUNITY)
Admission: EM | Admit: 2020-04-15 | Discharge: 2020-04-17 | DRG: 337 | Disposition: A | Discharge status: Home / Self Care | Payer: Medicaid Other | Attending: General Surgery | Admitting: General Surgery

## 2020-04-15 ENCOUNTER — Encounter (HOSPITAL_COMMUNITY): Payer: Self-pay

## 2020-04-15 DIAGNOSIS — Z885 Allergy status to narcotic agent status: Secondary | ICD-10-CM | POA: Diagnosis not present

## 2020-04-15 DIAGNOSIS — Z87891 Personal history of nicotine dependence: Secondary | ICD-10-CM | POA: Diagnosis not present

## 2020-04-15 DIAGNOSIS — Z91040 Latex allergy status: Secondary | ICD-10-CM | POA: Diagnosis not present

## 2020-04-15 DIAGNOSIS — M1990 Unspecified osteoarthritis, unspecified site: Secondary | ICD-10-CM | POA: Diagnosis present

## 2020-04-15 DIAGNOSIS — J439 Emphysema, unspecified: Secondary | ICD-10-CM | POA: Diagnosis present

## 2020-04-15 DIAGNOSIS — F419 Anxiety disorder, unspecified: Secondary | ICD-10-CM | POA: Diagnosis present

## 2020-04-15 DIAGNOSIS — E785 Hyperlipidemia, unspecified: Secondary | ICD-10-CM | POA: Diagnosis present

## 2020-04-15 DIAGNOSIS — Z79899 Other long term (current) drug therapy: Secondary | ICD-10-CM | POA: Diagnosis not present

## 2020-04-15 DIAGNOSIS — F909 Attention-deficit hyperactivity disorder, unspecified type: Secondary | ICD-10-CM | POA: Diagnosis present

## 2020-04-15 DIAGNOSIS — Z981 Arthrodesis status: Secondary | ICD-10-CM | POA: Diagnosis not present

## 2020-04-15 DIAGNOSIS — Z7984 Long term (current) use of oral hypoglycemic drugs: Secondary | ICD-10-CM | POA: Diagnosis not present

## 2020-04-15 DIAGNOSIS — G43909 Migraine, unspecified, not intractable, without status migrainosus: Secondary | ICD-10-CM | POA: Diagnosis present

## 2020-04-15 DIAGNOSIS — Z20822 Contact with and (suspected) exposure to covid-19: Secondary | ICD-10-CM | POA: Diagnosis present

## 2020-04-15 DIAGNOSIS — K561 Intussusception: Secondary | ICD-10-CM | POA: Diagnosis present

## 2020-04-15 DIAGNOSIS — M797 Fibromyalgia: Secondary | ICD-10-CM | POA: Diagnosis present

## 2020-04-15 DIAGNOSIS — E119 Type 2 diabetes mellitus without complications: Secondary | ICD-10-CM | POA: Diagnosis present

## 2020-04-15 DIAGNOSIS — K660 Peritoneal adhesions (postprocedural) (postinfection): Secondary | ICD-10-CM | POA: Diagnosis present

## 2020-04-15 DIAGNOSIS — F329 Major depressive disorder, single episode, unspecified: Secondary | ICD-10-CM | POA: Diagnosis present

## 2020-04-15 DIAGNOSIS — K5909 Other constipation: Secondary | ICD-10-CM | POA: Diagnosis present

## 2020-04-15 LAB — CBC WITH DIFFERENTIAL/PLATELET
Abs Immature Granulocytes: 0.01 10*3/uL (ref 0.00–0.07)
Basophils Absolute: 0.1 10*3/uL (ref 0.0–0.1)
Basophils Relative: 1 %
Eosinophils Absolute: 0.1 10*3/uL (ref 0.0–0.5)
Eosinophils Relative: 2 %
HCT: 39.2 % (ref 36.0–46.0)
Hemoglobin: 12.5 g/dL (ref 12.0–15.0)
Immature Granulocytes: 0 %
Lymphocytes Relative: 49 %
Lymphs Abs: 3.5 10*3/uL (ref 0.7–4.0)
MCH: 30.6 pg (ref 26.0–34.0)
MCHC: 31.9 g/dL (ref 30.0–36.0)
MCV: 95.8 fL (ref 80.0–100.0)
Monocytes Absolute: 0.6 10*3/uL (ref 0.1–1.0)
Monocytes Relative: 8 %
Neutro Abs: 2.9 10*3/uL (ref 1.7–7.7)
Neutrophils Relative %: 40 %
Platelets: 309 10*3/uL (ref 150–400)
RBC: 4.09 MIL/uL (ref 3.87–5.11)
RDW: 12.4 % (ref 11.5–15.5)
WBC: 7.1 10*3/uL (ref 4.0–10.5)
nRBC: 0 % (ref 0.0–0.2)

## 2020-04-15 LAB — LACTIC ACID, PLASMA
Lactic Acid, Venous: 1.1 mmol/L (ref 0.5–1.9)
Lactic Acid, Venous: 0.9 mmol/L (ref 0.5–1.9)

## 2020-04-15 LAB — RESPIRATORY PANEL BY RT PCR (FLU A&B, COVID)
Influenza A by PCR: NEGATIVE
Influenza B by PCR: NEGATIVE
SARS Coronavirus 2 by RT PCR: NEGATIVE

## 2020-04-15 LAB — COMPREHENSIVE METABOLIC PANEL
ALT: 15 U/L (ref 0–44)
AST: 17 U/L (ref 15–41)
Albumin: 4.5 g/dL (ref 3.5–5.0)
Alkaline Phosphatase: 42 U/L (ref 38–126)
Anion gap: 10 (ref 5–15)
BUN: 10 mg/dL (ref 8–23)
CO2: 28 mmol/L (ref 22–32)
Calcium: 9.4 mg/dL (ref 8.9–10.3)
Chloride: 100 mmol/L (ref 98–111)
Creatinine, Ser: 0.69 mg/dL (ref 0.44–1.00)
GFR calc Af Amer: >60 mL/min (ref >60)
GFR calc non Af Amer: >60 mL/min (ref >60)
Glucose, Bld: 97 mg/dL (ref 70–99)
Potassium: 3.9 mmol/L (ref 3.5–5.1)
Sodium: 138 mmol/L (ref 135–145)
Total Bilirubin: 0.5 mg/dL (ref 0.3–1.2)
Total Protein: 7.2 g/dL (ref 6.5–8.1)

## 2020-04-15 LAB — HEMOGLOBIN A1C
Hgb A1c MFr Bld: 6.2 % — ABNORMAL HIGH (ref 4.8–5.6)
Mean Plasma Glucose: 131.24 mg/dL

## 2020-04-15 LAB — LIPASE, BLOOD: Lipase: 113 U/L — ABNORMAL HIGH (ref 11–51)

## 2020-04-15 LAB — CBG MONITORING, ED: Glucose-Capillary: 74 mg/dL (ref 70–99)

## 2020-04-15 LAB — GLUCOSE, CAPILLARY
Glucose-Capillary: 84 mg/dL (ref 70–99)
Glucose-Capillary: 71 mg/dL (ref 70–99)
Glucose-Capillary: 86 mg/dL (ref 70–99)
Glucose-Capillary: 76 mg/dL (ref 70–99)

## 2020-04-15 LAB — HIV ANTIBODY (ROUTINE TESTING W REFLEX): HIV Screen 4th Generation wRfx: NONREACTIVE

## 2020-04-15 IMAGING — CT CT ABD-PELV W/ CM
2 of 5 series · 15 of 46 positions shown, 17 images · IV contrast (OMNIPAQUE 300)
Comparison: [DATE]

CLINICAL DATA: Abdominal pain, recent CT on [DATE] with
intussusception

EXAM:
CT ABDOMEN AND PELVIS WITH CONTRAST
TECHNIQUE: Multidetector CT imaging of the abdomen and pelvis was performed
using the standard protocol following bolus administration of
intravenous contrast.
CONTRAST:  100mL OMNIPAQUE IOHEXOL 300 MG/ML  SOLN

[Series 2: axial st · axial · 0.72mm/px · z∈[+1100,+1525]mm · 12 of 97 slices shown, 14 images]
[im 6/97  soft-tissue]
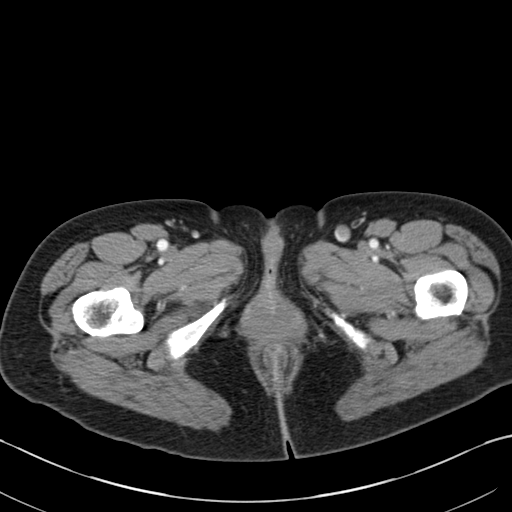
[im 6/97  bone]
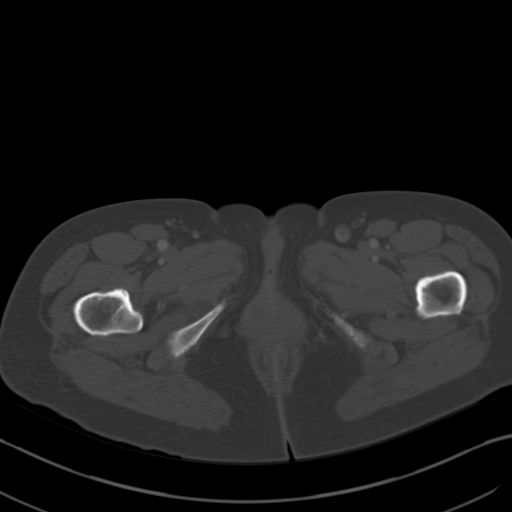
[im 17/97  soft-tissue]
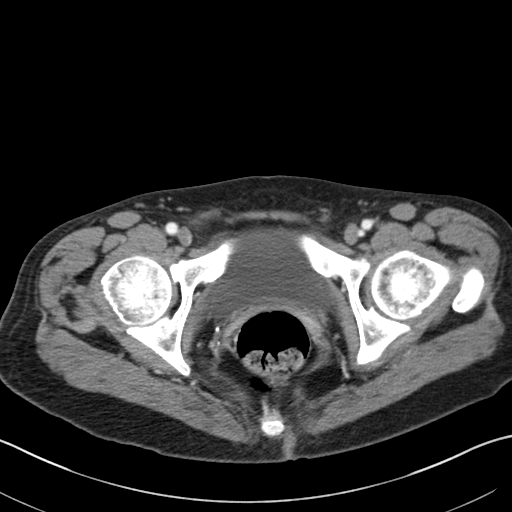
[im 23/97  soft-tissue]
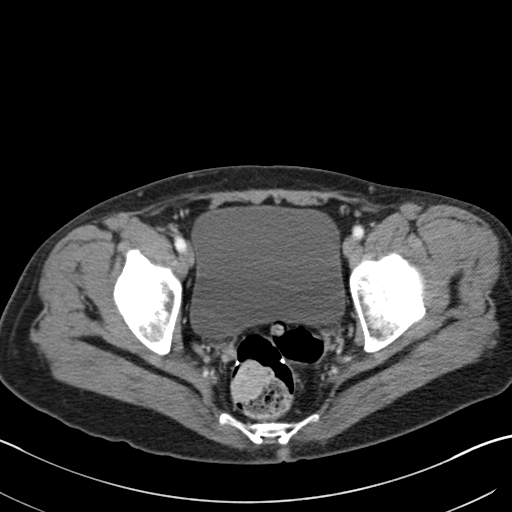
[im 29/97  soft-tissue]
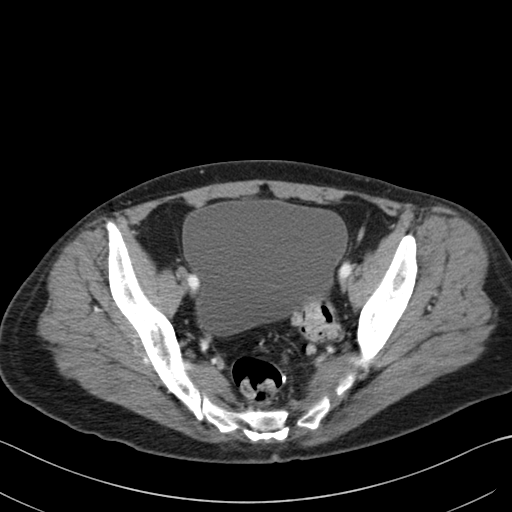
[im 40/97  soft-tissue]
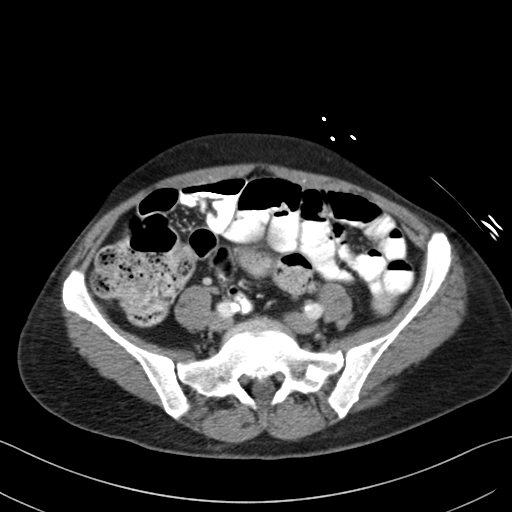
[im 46/97  soft-tissue]
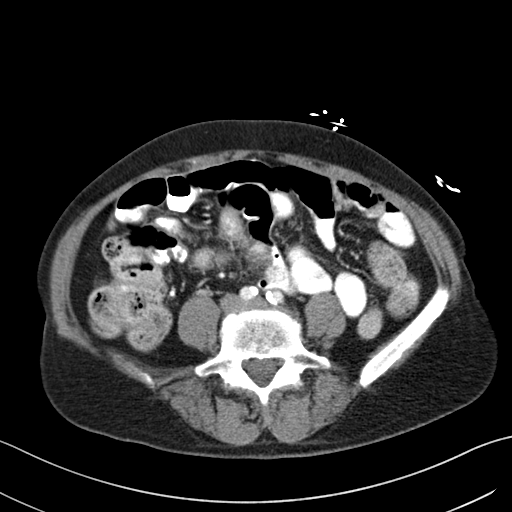
[im 51/97  soft-tissue]
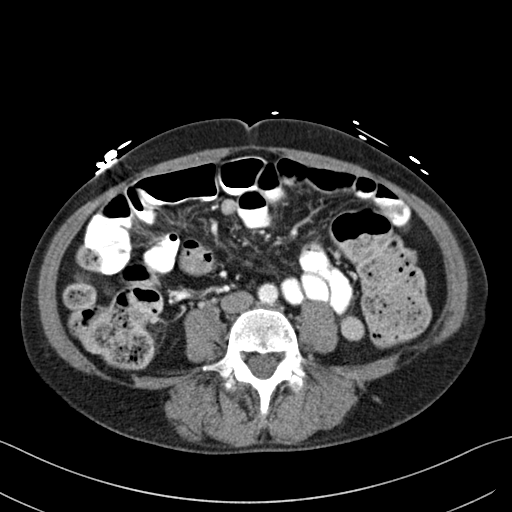
[im 63/97  soft-tissue]
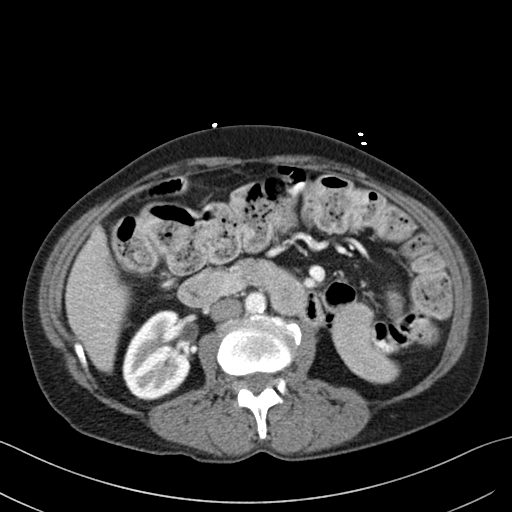
[im 68/97  soft-tissue]
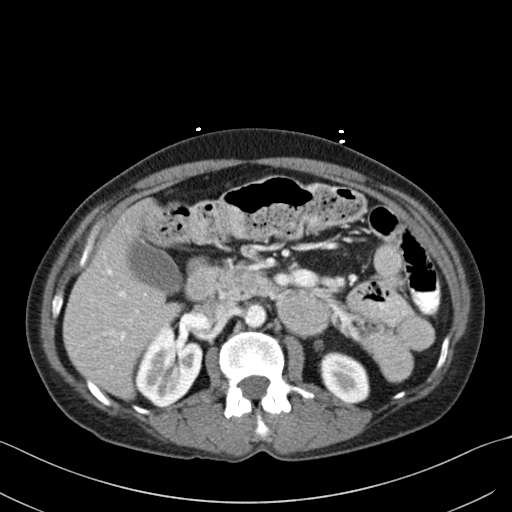
[im 68/97  bone]
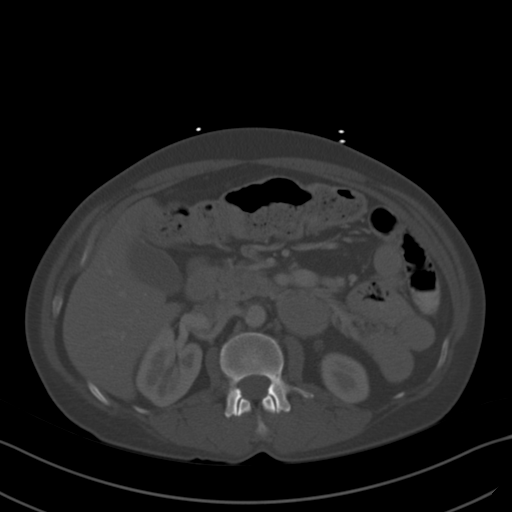
[im 74/97  soft-tissue]
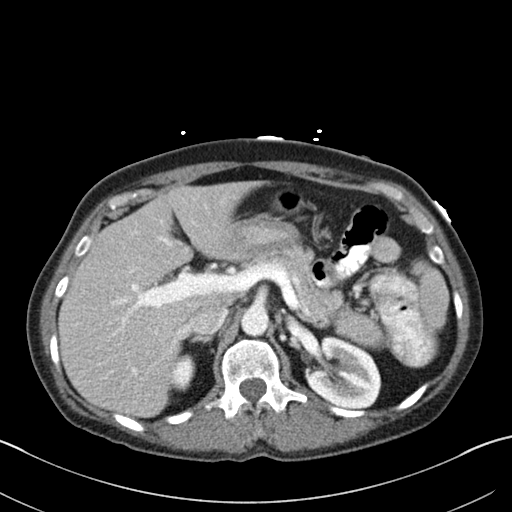
[im 85/97  soft-tissue]
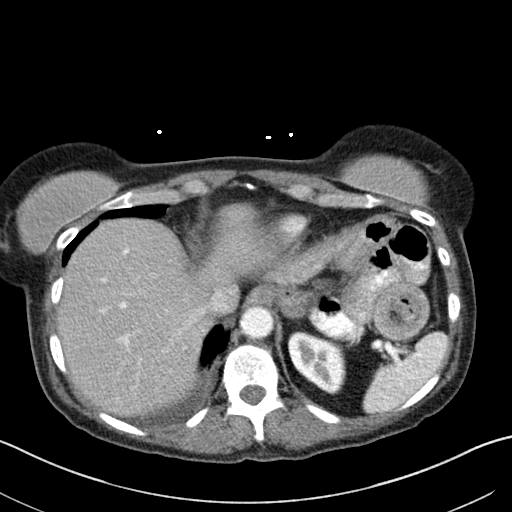
[im 91/97  soft-tissue]
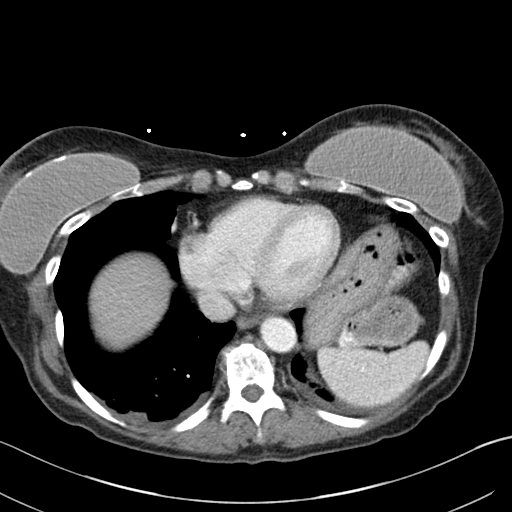

[Series 4: coronal st · coronal · 0.67mm/px · 3 of 122 slices shown]
[im 41/122  soft-tissue]
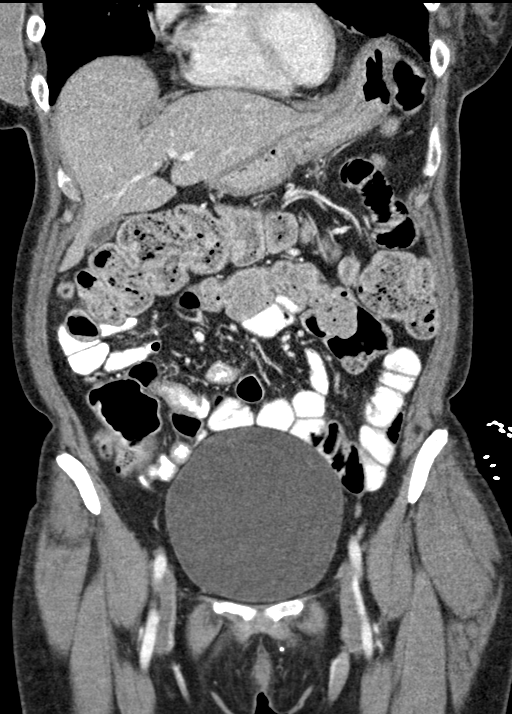
[im 54/122  soft-tissue]
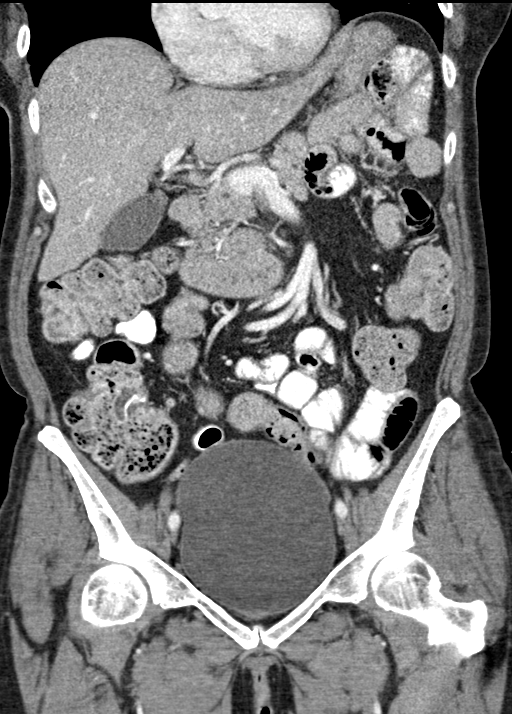
[im 68/122  soft-tissue]
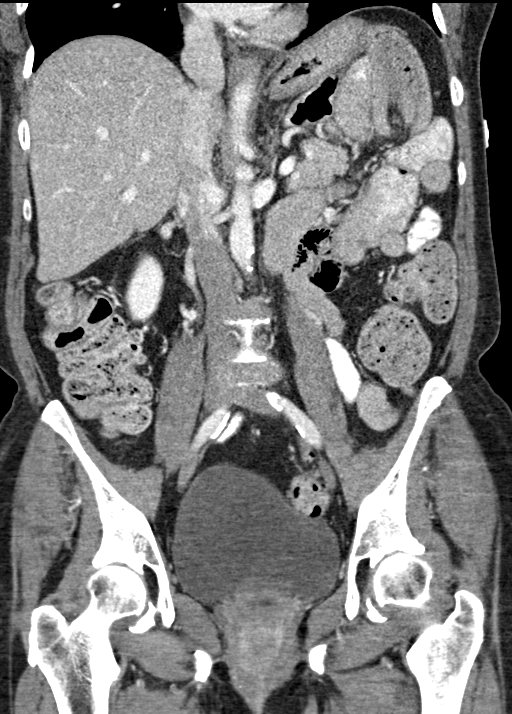

[15 of 46 positions shown; findings below may reference images not displayed]

FINDINGS: Lower chest: The visualized heart size within normal limits. No
pericardial fluid/thickening.

No hiatal hernia.

Streaky atelectasis of both lung bases.

Hepatobiliary: Again noted is a 7 cm low-density lesion in the
anterior left liver lobe.The main portal vein is patent. Somewhat
nodular cystic area seen within the gallbladder fundus which could
be due to adenomyomatosis. No surrounding gallbladder wall
thickening.

Pancreas: Unremarkable. No pancreatic ductal dilatation or
surrounding inflammatory changes.

Spleen: Normal in size without focal abnormality.

Adrenals/Urinary Tract: Both adrenal glands appear normal. The
kidneys and collecting system appear normal without evidence of
urinary tract calculus or hydronephrosis. Bladder is unremarkable.

Stomach/Bowel: The stomach is normal in appearance. Again noted is
an approximately 6 cm segment of jejunum within the left upper
quadrant best seen on series 4, image 69 with a a focal
intussusception. There is mild mesentery a swirling seen within this
region. No focal wall thickening or surrounding fat stranding
changes however are seen. No focal transition point is identified.
The remainder of the small bowel is decompressed. There is a rectal
anastomosis present. There is a moderate amount of colonic stool.

Vascular/Lymphatic: There are no enlarged mesenteric,
retroperitoneal, or pelvic lymph nodes. Scattered aortic
atherosclerotic calcifications are seen without aneurysmal
dilatation.

Reproductive: The patient is status post hysterectomy. No adnexal
masses or collections seen.

Other: No evidence of abdominal wall mass or hernia.

Musculoskeletal: No acute or significant osseous findings.
IMPRESSION: 1. 6 cm long segment of jejunal intussusception within the left
upper quadrant. No evidence of bowel obstruction or focal lead point
however identified. This appears grossly unchanged from the prior
exam.
2.  Aortic Atherosclerosis ([EE]-[EE]).
3. Bibasilar dependent atelectasis.
4. Probable gallbladder adenomyomatosis

## 2020-04-15 MED ORDER — HYDROMORPHONE HCL 1 MG/ML IJ SOLN
0.5000 mg | INTRAMUSCULAR | Status: DC | PRN
  Administered 2020-04-15 – 2020-04-16 (×4): 1 mg via INTRAVENOUS
  Filled 2020-04-15 (×4): qty 1

## 2020-04-15 MED ORDER — GABAPENTIN 300 MG PO CAPS
300.0000 mg | ORAL_CAPSULE | Freq: Two times a day (BID) | ORAL | Status: DC
  Administered 2020-04-15 – 2020-04-17 (×4): 300 mg via ORAL
  Filled 2020-04-15 (×4): qty 1

## 2020-04-15 MED ORDER — METHYLPHENIDATE HCL 10 MG PO TABS
20.0000 mg | ORAL_TABLET | Freq: Three times a day (TID) | ORAL | Status: DC
  Administered 2020-04-17: 20 mg via ORAL
  Filled 2020-04-15 (×4): qty 2

## 2020-04-15 MED ORDER — FENTANYL CITRATE (PF) 100 MCG/2ML IJ SOLN
50.0000 ug | Freq: Once | INTRAMUSCULAR | Status: AC
  Administered 2020-04-15: 50 ug via INTRAVENOUS
  Filled 2020-04-15: qty 2

## 2020-04-15 MED ORDER — INSULIN ASPART 100 UNIT/ML ~~LOC~~ SOLN: 0.0000 [IU] | Freq: Every day | SUBCUTANEOUS | Status: DC

## 2020-04-15 MED ORDER — INSULIN ASPART 100 UNIT/ML ~~LOC~~ SOLN
0.0000 [IU] | Freq: Three times a day (TID) | SUBCUTANEOUS | Status: DC
  Filled 2020-04-15: qty 0.09

## 2020-04-15 MED ORDER — DIPHENHYDRAMINE HCL 12.5 MG/5ML PO ELIX: 12.5000 mg | ORAL_SOLUTION | Freq: Four times a day (QID) | ORAL | Status: DC | PRN

## 2020-04-15 MED ORDER — DOCUSATE SODIUM 100 MG PO CAPS
200.0000 mg | ORAL_CAPSULE | Freq: Two times a day (BID) | ORAL | Status: DC
  Administered 2020-04-15 – 2020-04-17 (×4): 200 mg via ORAL
  Filled 2020-04-15 (×4): qty 2

## 2020-04-15 MED ORDER — LACTATED RINGERS IV SOLN: INTRAVENOUS | Status: DC

## 2020-04-15 MED ORDER — DIPHENHYDRAMINE HCL 50 MG/ML IJ SOLN: 12.5000 mg | Freq: Four times a day (QID) | INTRAMUSCULAR | Status: DC | PRN

## 2020-04-15 MED ORDER — LURASIDONE HCL 40 MG PO TABS
40.0000 mg | ORAL_TABLET | Freq: Every day | ORAL | Status: DC
  Administered 2020-04-15 – 2020-04-17 (×2): 40 mg via ORAL
  Filled 2020-04-15 (×3): qty 1

## 2020-04-15 MED ORDER — IOHEXOL 9 MG/ML PO SOLN
ORAL | Status: AC
  Administered 2020-04-15: 04:00:00 1000 mL via ORAL
  Filled 2020-04-15: qty 1000

## 2020-04-15 MED ORDER — IOHEXOL 300 MG/ML  SOLN
100.0000 mL | Freq: Once | INTRAMUSCULAR | Status: AC | PRN
  Administered 2020-04-15: 06:00:00 100 mL via INTRAVENOUS

## 2020-04-15 MED ORDER — SIMETHICONE 80 MG PO CHEW
40.0000 mg | CHEWABLE_TABLET | Freq: Four times a day (QID) | ORAL | Status: DC | PRN
  Administered 2020-04-15: 40 mg via ORAL
  Filled 2020-04-15: qty 1

## 2020-04-15 MED ORDER — FENTANYL CITRATE (PF) 100 MCG/2ML IJ SOLN
100.0000 ug | Freq: Once | INTRAMUSCULAR | Status: AC
  Administered 2020-04-15: 100 ug via INTRAVENOUS
  Filled 2020-04-15: qty 2

## 2020-04-15 MED ORDER — LORATADINE 10 MG PO TABS: 10.0000 mg | ORAL_TABLET | Freq: Every day | ORAL | Status: DC | PRN

## 2020-04-15 MED ORDER — IOHEXOL 9 MG/ML PO SOLN: 1000.0000 mL | ORAL | Status: AC

## 2020-04-15 MED ORDER — ALBUTEROL SULFATE (2.5 MG/3ML) 0.083% IN NEBU: 2.5000 mg | INHALATION_SOLUTION | Freq: Four times a day (QID) | RESPIRATORY_TRACT | Status: DC | PRN

## 2020-04-15 MED ORDER — DULOXETINE HCL 60 MG PO CPEP
120.0000 mg | ORAL_CAPSULE | Freq: Every day | ORAL | Status: DC
  Administered 2020-04-15 – 2020-04-16 (×2): 120 mg via ORAL
  Filled 2020-04-15: qty 2
  Filled 2020-04-15: qty 4

## 2020-04-15 MED ORDER — SUMATRIPTAN SUCCINATE 25 MG PO TABS
25.0000 mg | ORAL_TABLET | ORAL | Status: DC | PRN
  Administered 2020-04-15 – 2020-04-16 (×3): 25 mg via ORAL
  Filled 2020-04-15 (×5): qty 1

## 2020-04-15 MED ORDER — SODIUM CHLORIDE (PF) 0.9 % IJ SOLN
INTRAMUSCULAR | Status: AC
  Filled 2020-04-15: qty 50

## 2020-04-15 MED ORDER — ALPRAZOLAM 1 MG PO TABS
1.0000 mg | ORAL_TABLET | Freq: Every day | ORAL | Status: DC
  Administered 2020-04-15 – 2020-04-16 (×2): 1 mg via ORAL
  Filled 2020-04-15: qty 2
  Filled 2020-04-15: qty 1

## 2020-04-15 MED ORDER — SIMVASTATIN 20 MG PO TABS
10.0000 mg | ORAL_TABLET | Freq: Every day | ORAL | Status: DC
  Administered 2020-04-15 – 2020-04-16 (×2): 10 mg via ORAL
  Filled 2020-04-15 (×3): qty 1

## 2020-04-15 MED ORDER — FLUTICASONE PROPIONATE 50 MCG/ACT NA SUSP
2.0000 | Freq: Every day | NASAL | Status: DC
  Administered 2020-04-16: 2 via NASAL
  Filled 2020-04-15: qty 16

## 2020-04-15 MED ORDER — HEPARIN SODIUM (PORCINE) 5000 UNIT/ML IJ SOLN
5000.0000 [IU] | Freq: Three times a day (TID) | INTRAMUSCULAR | Status: DC
  Administered 2020-04-15 – 2020-04-16 (×3): 5000 [IU] via SUBCUTANEOUS
  Filled 2020-04-15 (×3): qty 1

## 2020-04-15 MED ORDER — ONDANSETRON HCL 4 MG/2ML IJ SOLN
4.0000 mg | Freq: Four times a day (QID) | INTRAMUSCULAR | Status: DC | PRN
  Filled 2020-04-15: qty 2

## 2020-04-15 MED ORDER — ONDANSETRON 4 MG PO TBDP: 4.0000 mg | ORAL_TABLET | Freq: Four times a day (QID) | ORAL | Status: DC | PRN

## 2020-04-15 MED ORDER — HYDROXYCHLOROQUINE SULFATE 200 MG PO TABS
200.0000 mg | ORAL_TABLET | Freq: Two times a day (BID) | ORAL | Status: DC
  Administered 2020-04-15 – 2020-04-17 (×4): 200 mg via ORAL
  Filled 2020-04-15 (×5): qty 1

## 2020-04-15 MED ORDER — TRAZODONE HCL 50 MG PO TABS
50.0000 mg | ORAL_TABLET | Freq: Every day | ORAL | Status: DC
  Administered 2020-04-15: 50 mg via ORAL
  Administered 2020-04-16: 100 mg via ORAL
  Filled 2020-04-15 (×2): qty 2

## 2020-04-15 MED ORDER — ALBUTEROL SULFATE (2.5 MG/3ML) 0.083% IN NEBU
2.5000 mg | INHALATION_SOLUTION | Freq: Every day | RESPIRATORY_TRACT | Status: DC
  Administered 2020-04-15 – 2020-04-17 (×2): 2.5 mg via RESPIRATORY_TRACT
  Filled 2020-04-15 (×3): qty 3

## 2020-04-15 MED ORDER — IBUPROFEN 400 MG PO TABS
600.0000 mg | ORAL_TABLET | Freq: Four times a day (QID) | ORAL | Status: DC | PRN
  Administered 2020-04-15 – 2020-04-16 (×2): 600 mg via ORAL
  Filled 2020-04-15: qty 3
  Filled 2020-04-15: qty 1

## 2020-04-15 MED ORDER — SODIUM CHLORIDE 0.9 % IV BOLUS
500.0000 mL | Freq: Once | INTRAVENOUS | Status: AC
  Administered 2020-04-15: 03:00:00 500 mL via INTRAVENOUS

## 2020-04-15 MED ORDER — ACETAMINOPHEN 500 MG PO TABS
1000.0000 mg | ORAL_TABLET | Freq: Four times a day (QID) | ORAL | Status: DC
  Administered 2020-04-15 – 2020-04-17 (×6): 1000 mg via ORAL
  Filled 2020-04-15 (×7): qty 2

## 2020-04-15 NOTE — ED Provider Notes (Incomplete)
59 year old female diagnosed with intussusception at [facility] yesterday, was concerned that they did not have a GI surgeon so left there to come here.  Pain is in the left mid to lower abdomen and is unchanged from yesterday.  Is worse with sitting.  She has been passing flatus.  She has chronic constipation which is unchanged.  She denies fever or chills and denies nausea or vomiting.  Abdomen is soft with left mid and lower abdomen tenderness, bowel sounds present.  CT from [facility] shows 5 cm intussusception in the jejunum, likely out of reach for GI interventions.  Will check labs and get surgical consultation. ?

## 2020-04-15 NOTE — ED Notes (Signed)
Patient transported to CT 

## 2020-04-15 NOTE — H&P (Addendum)
CC/Reason for consult: Small bowel intussusception, abdominal cramps x 3 days  Requesting provider: Dr. Preston FleetingGlick  HPI: Hannah Macias is an 59 y.o. female with hx of IBS-C, emphysema, lupus (on plaquenil), DM, HLD, fibromyalgia, postop nausea/vomiting, remote hx diverticulitis (underwent lap sigmoid 2009 in [place]) whom presented to ED at [facility] 04/13/20 with a 2 day history of left flank crampy sensations. She reports this felt similar to when she has been constipated from her IBS but lasted longer than normal. She wondered if she was having recurrent diverticulitis but hadn't had any issues with this since her sigmoidectomy 2009. She denies any history of nausea, vomiting, bloating, obstipation. She reports she has been "constipated" for the last week or two but has been having firm hard stool and continued to pass gas. She has been eating and drinking fine without issue through all of this. She presented to [facility] ED, was seen and evaluated and had CT that reportedly showed evident jejunal intussusception in LUQ. There was no obstruction. She was told she may need surgery but stated she didn't trust the surgeons there so left on her own accord on 3/25. The following day, she then came to [facility]. She has been stable here with similar symptoms, no abdominal distention nor pain and clinically has looked fine. She had a normal WBC and lactate. Given significant history of constipation/IBS, stool burden on CT and potential for the small bowel intussusception to have been transient oral contrast CT Was obtained and we have been asked to see.  Past Medical History:  Diagnosis Date  . ADHD (attention deficit hyperactivity disorder)   . Anxiety    takes Xanax at bedtime  . Arthritis   . Chronic constipation    Dulcolax nightly  . COPD (chronic obstructive pulmonary disease) (HCC)    Neb inhaler and ALbuterol inhaler  . Depression    takes Paxil daily  . Diabetes mellitus without complication (HCC)    takes metformin daily  . Diverticulitis   . History of blood transfusion    no abnormal reaction noted  . History of bronchitis 12/2015  . History of shingles   . Hyperlipidemia    takes Simvastatin daily  . Joint pain   . Lupus (HCC)    takes Plaquenil daily  . Migraines    takes Imitrex daily  . Pneumonia    hx of  . PONV (postoperative nausea and vomiting)   . Seasonal allergies    takes Claritin daily as needed.Flonase and Singulair at bedtime  . Seizures (HCC)    hx of-21 yrs ago.Was on Seizure meds but has been off > 5 yrs     Past Surgical History:  Procedure Laterality Date  . ABDOMINAL HYSTERECTOMY    . ANTERIOR CERVICAL DECOMPRESSION/DISCECTOMY FUSION 4 LEVELS N/A 07/12/2016   Procedure: CERVICAL THREE-FOUR CERVICAL FOUR-FIVE CERVICAL FIVE-SIX CERVICAL SIX-SEVEN  ANTERIOR CERVICAL DECOMPRESSION DISKECTOMY FUSION;  Surgeon: Loura HaltBenjamin Jared Ditty, MD;  Location: [facility];  Service: Neurosurgery;  Laterality: N/A;  . breast reduction with implants    . COLONOSCOPY    . PARTIAL COLECTOMY    . TONSILLECTOMY    . TUBAL LIGATION      No family history on file.  Social:  reports that she has quit smoking. She has never used smokeless tobacco. She reports that she does not drink alcohol or use drugs.  Allergies:  Allergies  Allergen Reactions  . Morphine And Related Other (See Comments)    Can't breathe - Panic attack  .  Latex Itching, Swelling and Rash  . Percocet [Oxycodone-Acetaminophen]     "speeds me up"  . Amoxicillin-Pot Clavulanate Rash    Medications: I have reviewed the patient's current medications.  Results for orders placed or performed during the hospital encounter of 04/15/20 (from the past 48 hour(s))  CBC with Differential     Status: None   Collection Time: 04/15/20  2:33 AM  Result Value Ref Range   WBC 7.1 4.0 - 10.5 K/uL   RBC 4.09 3.87 - 5.11 MIL/uL   Hemoglobin 12.5 12.0 - 15.0 g/dL   HCT 60.1 09.3 - 23.5 %   MCV 95.8 80.0 - 100.0 fL    MCH 30.6 26.0 - 34.0 pg   MCHC 31.9 30.0 - 36.0 g/dL   RDW 57.3 22.0 - 25.4 %   Platelets 309 150 - 400 K/uL   nRBC 0.0 0.0 - 0.2 %   Neutrophils Relative % 40 %   Neutro Abs 2.9 1.7 - 7.7 K/uL   Lymphocytes Relative 49 %   Lymphs Abs 3.5 0.7 - 4.0 K/uL   Monocytes Relative 8 %   Monocytes Absolute 0.6 0.1 - 1.0 K/uL   Eosinophils Relative 2 %   Eosinophils Absolute 0.1 0.0 - 0.5 K/uL   Basophils Relative 1 %   Basophils Absolute 0.1 0.0 - 0.1 K/uL   Immature Granulocytes 0 %   Abs Immature Granulocytes 0.01 0.00 - 0.07 K/uL    Comment: Performed at [facility], [address] [place], [place] [address]  Comprehensive metabolic panel     Status: None   Collection Time: 04/15/20  2:33 AM  Result Value Ref Range   Sodium 138 135 - 145 mmol/L   Potassium 3.9 3.5 - 5.1 mmol/L   Chloride 100 98 - 111 mmol/L   CO2 28 22 - 32 mmol/L   Glucose, Bld 97 70 - 99 mg/dL    Comment: Glucose reference range applies only to samples taken after fasting for at least 8 hours.   BUN 10 8 - 23 mg/dL   Creatinine, Ser 3.76 0.44 - 1.00 mg/dL   Calcium 9.4 8.9 - 28.3 mg/dL   Total Protein 7.2 6.5 - 8.1 g/dL   Albumin 4.5 3.5 - 5.0 g/dL   AST 17 15 - 41 U/L   ALT 15 0 - 44 U/L   Alkaline Phosphatase 42 38 - 126 U/L   Total Bilirubin 0.5 0.3 - 1.2 mg/dL   GFR calc non Af Amer >60 >60 mL/min   GFR calc Af Amer >60 >60 mL/min   Anion gap 10 5 - 15    Comment: Performed at [facility], [address], [place], [place] [address]  Lipase, blood     Status: Abnormal   Collection Time: 04/15/20  2:33 AM  Result Value Ref Range   Lipase 113 (H) 11 - 51 U/L    Comment: Performed at [facility], [address] [place], [place] [address]  Lactic acid, plasma     Status: None   Collection Time: 04/15/20  2:33 AM  Result Value Ref Range   Lactic Acid, Venous 1.1 0.5 - 1.9 mmol/L    Comment: Performed at [facility], [address], [place], [place] [address]  Lactic acid, plasma     Status: None   Collection Time: 04/15/20  4:04 AM  Result Value Ref Range   Lactic Acid, Venous 0.9 0.5 - 1.9 mmol/L  Comment: Performed at [facility], [address] [place], [place] [address]  Respiratory Panel by RT PCR (Flu A&B, Covid) - Nasopharyngeal Swab     Status: None   Collection Time: 04/15/20  4:04 AM   Specimen: Nasopharyngeal Swab  Result Value Ref Range   SARS Coronavirus 2 by RT PCR NEGATIVE NEGATIVE    Comment: (NOTE) SARS-CoV-2 target nucleic acids are NOT DETECTED. The SARS-CoV-2 RNA is generally detectable in upper respiratoy specimens during the acute phase of infection. The lowest concentration of SARS-CoV-2 viral copies this assay can detect is 131 copies/mL. A negative result does not preclude SARS-Cov-2 infection and should not be used as the sole basis for treatment or other patient management decisions. A negative result may occur with  improper specimen collection/handling, submission of specimen other than nasopharyngeal swab, presence of viral mutation(s) within the areas targeted by this assay, and inadequate number of viral copies (<131 copies/mL). A negative result must be combined with clinical observations, patient history, and epidemiological information. The expected result is Negative. Fact Sheet for Patients:  https://www.moore.com/ Fact Sheet for Healthcare Providers:  https://www.young.biz/ This test is not yet ap proved or cleared by the [place] FDA and  has been authorized for detection and/or diagnosis of SARS-CoV-2 by FDA under an Emergency Use Authorization (EUA). This EUA will remain  in effect (meaning this test can be used) for the duration of the COVID-19 declaration under Section 564(b)(1) of the Act, 21 U.S.C. section 360bbb-3(b)(1), unless the authorization is terminated or revoked sooner.     Influenza A by PCR NEGATIVE NEGATIVE   Influenza B by PCR NEGATIVE NEGATIVE    Comment: (NOTE) The Xpert Xpress SARS-CoV-2/FLU/RSV assay is intended as an aid in  the diagnosis of influenza from Nasopharyngeal swab specimens and  should not be used as a sole basis for treatment. Nasal washings and  aspirates are unacceptable for Xpert Xpress SARS-CoV-2/FLU/RSV  testing. Fact Sheet for Patients: https://www.moore.com/ Fact Sheet for Healthcare Providers: https://www.young.biz/ This test is not yet approved or cleared by the [place] FDA and  has been authorized for detection and/or diagnosis of SARS-CoV-2 by  FDA under an Emergency Use Authorization (EUA). This EUA will remain  in effect (meaning this test can be used) for the duration of the  Covid-19 declaration under Section 564(b)(1) of the Act, 21  U.S.C. section 360bbb-3(b)(1), unless the authorization is  terminated or revoked. Performed at [facility], [address] [place], [place] [address]     CT ABDOMEN PELVIS W CONTRAST  Result Date: 04/15/2020 CLINICAL DATA:  Abdominal pain, recent CT on 04/13/2020 with intussusception EXAM: CT ABDOMEN AND PELVIS WITH CONTRAST TECHNIQUE: Multidetector CT imaging of the abdomen and pelvis was performed using the standard protocol following bolus administration of intravenous contrast. CONTRAST:  OMNIPAQUE IOHEXOL 300 MG/ML  SOLN COMPARISON:  04/13/2020 FINDINGS: Lower chest: The visualized heart size within normal limits. No pericardial fluid/thickening. No hiatal hernia. Streaky atelectasis of both lung bases. Hepatobiliary: Again noted is a 7 cm low-density lesion in the anterior left liver lobe.The main portal vein is patent. Somewhat nodular cystic area seen within the gallbladder fundus which could be due to adenomyomatosis. No surrounding gallbladder wall thickening. Pancreas: Unremarkable. No pancreatic ductal  dilatation or surrounding inflammatory changes. Spleen: Normal in size without focal abnormality. Adrenals/Urinary Tract: Both adrenal glands appear normal. The kidneys and collecting system appear normal without evidence of urinary tract calculus or hydronephrosis. Bladder is unremarkable. Stomach/Bowel:  The stomach is normal in appearance. Again noted is an approximately 6 cm segment of jejunum within the left upper quadrant best seen on series 4, image 69 with a a focal intussusception. There is mild mesentery a swirling seen within this region. No focal wall thickening or surrounding fat stranding changes however are seen. No focal transition point is identified. The remainder of the small bowel is decompressed. There is a rectal anastomosis present. There is a moderate amount of colonic stool. Vascular/Lymphatic: There are no enlarged mesenteric, retroperitoneal, or pelvic lymph nodes. Scattered aortic atherosclerotic calcifications are seen without aneurysmal dilatation. Reproductive: The patient is status post hysterectomy. No adnexal masses or collections seen. Other: No evidence of abdominal wall mass or hernia. Musculoskeletal: No acute or significant osseous findings. IMPRESSION: 1. 6 cm long segment of jejunal intussusception within the left upper quadrant. No evidence of bowel obstruction or focal lead point however identified. This appears grossly unchanged from the prior exam. 2.  Aortic Atherosclerosis (ICD10-I70.0). 3. Bibasilar dependent atelectasis. 4. 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   On: 04/15/2020 06:30    ROS - all of the below systems have been reviewed with the patient and positives are indicated with bold text General: chills, fever or night sweats Eyes: blurry vision or double vision ENT: epistaxis or sore throat Allergy/Immunology: itchy/watery eyes or nasal congestion Hematologic/Lymphatic: bleeding problems, blood clots or  swollen lymph nodes Endocrine: temperature intolerance or unexpected weight changes Breast: new or changing breast lumps or nipple discharge Resp: cough, shortness of breath, or wheezing CV: chest pain or dyspnea on exertion GI: as per HPI GU: dysuria, trouble voiding, or hematuria MSK: joint pain or joint stiffness Neuro: TIA or stroke symptoms Derm: pruritus and skin lesion changes Psych: anxiety and depression  PE Blood pressure (!) 143/97, pulse 68, temperature 98.4 F (36.9 C), resp. rate 16, height 5\' 8"  (1.727 m), weight 59 kg, SpO2 98 %. Constitutional: NAD; conversant; no deformities Eyes: Moist conjunctiva; no lid lag; anicteric; PERRL Neck: Trachea midline; no thyromegaly Lungs: Normal respiratory effort; no tactile fremitus CV: RRR; no palpable thrills; no pitting edema GI: Abd soft, nontender, nondistended; no palpable hepatosplenomegaly MSK: Normal range of motion of extremities; no clubbing/cyanosis Psychiatric: Appropriate affect; alert and oriented x3 Lymphatic: No palpable cervical or axillary lymphadenopathy  Results for orders placed or performed during the hospital encounter of 04/15/20 (from the past 48 hour(s))  CBC with Differential     Status: None   Collection Time: 04/15/20  2:33 AM  Result Value Ref Range   WBC 7.1 4.0 - 10.5 K/uL   RBC 4.09 3.87 - 5.11 MIL/uL   Hemoglobin 12.5 12.0 - 15.0 g/dL   HCT 39.2 36.0 - 46.0 %   MCV 95.8 80.0 - 100.0 fL   MCH 30.6 26.0 - 34.0 pg   MCHC 31.9 30.0 - 36.0 g/dL   RDW 12.4 11.5 - 15.5 %   Platelets 309 150 - 400 K/uL   nRBC 0.0 0.0 - 0.2 %   Neutrophils Relative % 40 %   Neutro Abs 2.9 1.7 - 7.7 K/uL   Lymphocytes Relative 49 %   Lymphs Abs 3.5 0.7 - 4.0 K/uL   Monocytes Relative 8 %   Monocytes Absolute 0.6 0.1 - 1.0 K/uL   Eosinophils Relative 2 %   Eosinophils Absolute 0.1 0.0 - 0.5 K/uL   Basophils Relative 1 %   Basophils Absolute 0.1 0.0 - 0.1 K/uL   Immature  Granulocytes 0 %   Abs Immature  Granulocytes 0.01 0.00 - 0.07 K/uL    Comment: Performed at [facility], [address] [place], [place] [address]  Comprehensive metabolic panel     Status: None   Collection Time: 04/15/20  2:33 AM  Result Value Ref Range   Sodium 138 135 - 145 mmol/L   Potassium 3.9 3.5 - 5.1 mmol/L   Chloride 100 98 - 111 mmol/L   CO2 28 22 - 32 mmol/L   Glucose, Bld 97 70 - 99 mg/dL    Comment: Glucose reference range applies only to samples taken after fasting for at least 8 hours.   BUN 10 8 - 23 mg/dL   Creatinine, Ser 6.29 0.44 - 1.00 mg/dL   Calcium 9.4 8.9 - 52.8 mg/dL   Total Protein 7.2 6.5 - 8.1 g/dL   Albumin 4.5 3.5 - 5.0 g/dL   AST 17 15 - 41 U/L   ALT 15 0 - 44 U/L   Alkaline Phosphatase 42 38 - 126 U/L   Total Bilirubin 0.5 0.3 - 1.2 mg/dL   GFR calc non Af Amer >60 >60 mL/min   GFR calc Af Amer >60 >60 mL/min   Anion gap 10 5 - 15    Comment: Performed at [facility], [address] [place], [place] [address]  Lipase, blood     Status: Abnormal   Collection Time: 04/15/20  2:33 AM  Result Value Ref Range   Lipase 113 (H) 11 - 51 U/L    Comment: Performed at [facility], [address], [place], [place] [address]  Lactic acid, plasma     Status: None   Collection Time: 04/15/20  2:33 AM  Result Value Ref Range   Lactic Acid, Venous 1.1 0.5 - 1.9 mmol/L    Comment: Performed at [facility], [address] [place], [place] [address]  Lactic acid, plasma     Status: None   Collection Time: 04/15/20  4:04 AM  Result Value Ref Range   Lactic Acid, Venous 0.9 0.5 - 1.9 mmol/L    Comment: Performed at [facility], [address], [place], [place] [address]  Respiratory Panel by RT PCR (Flu A&B, Covid) - Nasopharyngeal Swab     Status: None   Collection Time: 04/15/20  4:04 AM   Specimen: Nasopharyngeal Swab  Result Value Ref Range   SARS Coronavirus 2 by RT PCR NEGATIVE  NEGATIVE    Comment: (NOTE) SARS-CoV-2 target nucleic acids are NOT DETECTED. The SARS-CoV-2 RNA is generally detectable in upper respiratoy specimens during the acute phase of infection. The lowest concentration of SARS-CoV-2 viral copies this assay can detect is 131 copies/mL. A negative result does not preclude SARS-Cov-2 infection and should not be used as the sole basis for treatment or other patient management decisions. A negative result may occur with  improper specimen collection/handling, submission of specimen other than nasopharyngeal swab, presence of viral mutation(s) within the areas targeted by this assay, and inadequate number of viral copies (<131 copies/mL). A negative result must be combined with clinical observations, patient history, and epidemiological information. The expected result is Negative. Fact Sheet for Patients:  https://www.moore.com/ Fact Sheet for Healthcare Providers:  https://www.young.biz/ This test is not yet ap proved or cleared by the [place] FDA and  has been authorized for detection and/or diagnosis of SARS-CoV-2 by FDA under an Emergency Use Authorization (EUA). This EUA will remain  in effect (meaning this test can be used) for the duration of the COVID-19 declaration under Section 564(b)(1) of the Act, 21 U.S.C. section 360bbb-3(b)(1), unless the authorization is terminated or revoked sooner.    Influenza A by PCR NEGATIVE NEGATIVE   Influenza B by PCR NEGATIVE NEGATIVE    Comment: (NOTE) The Xpert Xpress SARS-CoV-2/FLU/RSV assay is intended as an aid in  the diagnosis of influenza from Nasopharyngeal swab specimens and  should not be used as a sole basis for treatment. Nasal washings and  aspirates are unacceptable for Xpert Xpress SARS-CoV-2/FLU/RSV  testing. Fact Sheet for Patients: https://www.moore.com/ Fact Sheet for Healthcare  Providers: https://www.young.biz/ This test is not yet approved or cleared by the [place] FDA and  has been authorized for detection and/or diagnosis of SARS-CoV-2 by  FDA under an Emergency Use Authorization (EUA). This EUA will remain  in effect (meaning this test can be used) for the duration of the  Covid-19 declaration under Section 564(b)(1) of the Act, 21  U.S.C. section 360bbb-3(b)(1), unless the authorization is  terminated or revoked. Performed at [facility], [address], [place], [place] [address]     CT ABDOMEN PELVIS W CONTRAST  Result Date: 04/15/2020 CLINICAL DATA:  Abdominal pain, recent CT on 04/13/2020 with intussusception EXAM: CT ABDOMEN AND PELVIS WITH CONTRAST TECHNIQUE: Multidetector CT imaging of the abdomen and pelvis was performed using the standard protocol following bolus administration of intravenous contrast. CONTRAST:  OMNIPAQUE IOHEXOL 300 MG/ML  SOLN COMPARISON:  04/13/2020 FINDINGS: Lower chest: The visualized heart size within normal limits. No pericardial fluid/thickening. No hiatal hernia. Streaky atelectasis of both lung bases. Hepatobiliary: Again noted is a 7 cm low-density lesion in the anterior left liver lobe.The main portal vein is patent. Somewhat nodular cystic area seen within the gallbladder fundus which could be due to adenomyomatosis. No surrounding gallbladder wall thickening. Pancreas: Unremarkable. No pancreatic ductal dilatation or surrounding inflammatory changes. Spleen: Normal in size without focal abnormality. Adrenals/Urinary Tract: Both adrenal glands appear normal. The kidneys and collecting system appear normal without evidence of urinary tract calculus or hydronephrosis. Bladder is unremarkable. Stomach/Bowel: The stomach is normal in appearance. Again noted is an approximately 6 cm segment of jejunum within the left upper quadrant best seen on series 4, image 69 with a a focal  intussusception. There is mild mesentery a swirling seen within this region. No focal wall thickening or surrounding fat stranding changes however are seen. No focal transition point is identified. The remainder of the small bowel is decompressed. There is a rectal anastomosis present. There is a moderate amount of colonic stool. Vascular/Lymphatic: There are no enlarged mesenteric, retroperitoneal, or pelvic lymph nodes. Scattered aortic atherosclerotic calcifications are seen without aneurysmal dilatation. Reproductive: The patient is status post hysterectomy. No adnexal masses or collections seen. Other: No evidence of abdominal wall mass or hernia. Musculoskeletal: No acute or significant osseous findings. IMPRESSION: 1. 6 cm long segment of jejunal intussusception within the left upper quadrant. No evidence of bowel obstruction or focal lead point however identified. This appears grossly unchanged from the prior exam. 2.  Aortic Atherosclerosis (ICD10-I70.0). 3. Bibasilar dependent atelectasis. 4. 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   On: 04/15/2020 06:30   A/P: Hannah Macias is an 59 y.o. female with IBS-C, emphysema, lupus (on plaquenil), DM, HLD, fibromyalgia, postop nausea/vomiting, remote hx diverticulitis here with 3d hx of crampy pains in left abdomen and imaging revealing a small bowel  intussusception - no evident obstruction  -The anatomy and physiology of the GI tract was discussed at length with the patient. The pathophysiology of small bowel intussusception was discussed at length with associated pictures. -We reviewed abdominal exploration and small bowel resection to further address this - we also discussed potential for incomplete symptom resolution given history -The procedure, material risks (including, but not limited to, pain, bleeding, infection, scarring, need for blood transfusion, damage to surrounding structures- blood  vessels/nerves/viscus/organs, leak from anastomosis, enterocutaneous fistula, need for additional procedures, chronic pain, worsening of pre-existing medical conditions, hernia, recurrence of intussusception in other portions of GI tract, pneumonia, heart attack, stroke, death) benefits and alternatives to surgery were discussed at length. The patient's questions were answered to her satisfactidiscussed on, she voiced understanding and elected to proceed with surgery. Additionally, we typical postoperative expectations and the recovery process. -Timing as per OR availability in our acute care surgery room; she has no evidence of perforation nor obstruction at this time has been eating, and this has been going on for multiple days now. Anticipate will need for a small bowel resection regardless of etiology  Stephanie Coup. Cliffton Asters, M.D. [facility]  Use [email or web address] to contact on call provider

## 2020-04-15 NOTE — ED Provider Notes (Addendum)
[facility] DEPT Provider Note   CSN: [id number] Arrival date & time: 04/14/20  2008     History Chief Complaint  Patient presents with  . Abdominal Pain    Hannah Macias is a 59 y.o. female.  Patient with a history of IBS, COPD, DM, diverticulitis, HLD, lupus (on Plaquenil)  to ED for treatment of intussusception. She reports left lower abdominal pain that started 2 days ago. No nausea, vomiting or fever. She thought it might be recurrent diverticulitis and went to [facility] yesterday for evaluation. She reports a CT was done and showed intussusception. She did not want to stay at [facility] for surgery so she left and came to [facility]. She reports constipation since symptoms began.   The history is provided by the patient. No language interpreter was used.  Abdominal Pain Associated symptoms: constipation   Associated symptoms: no chest pain, no chills, no dysuria, no fever, no nausea, no shortness of breath and no vomiting        Past Medical History:  Diagnosis Date  . ADHD (attention deficit hyperactivity disorder)   . Anxiety    takes Xanax at bedtime  . Arthritis   . Chronic constipation    Dulcolax nightly  . COPD (chronic obstructive pulmonary disease) (HCC)    Neb inhaler and ALbuterol inhaler  . Depression    takes Paxil daily  . Diabetes mellitus without complication ([place])    takes metformin daily  . Diverticulitis   . History of blood transfusion    no abnormal reaction noted  . History of bronchitis 12/2015  . History of shingles   . Hyperlipidemia    takes Simvastatin daily  . Joint pain   . Lupus ([place])    takes Plaquenil daily  . Migraines    takes Imitrex daily  . Pneumonia    hx of  . PONV (postoperative nausea and vomiting)   . Seasonal allergies    takes Claritin daily as needed.Flonase and Singulair at bedtime  . Seizures ([place])    hx of-21 yrs ago.Was on Seizure meds but has been off > 5 yrs      Patient Active Problem List   Diagnosis Date Noted  . Cervical spondylosis with radiculopathy 07/12/2016    Past Surgical History:  Procedure Laterality Date  . ABDOMINAL HYSTERECTOMY    . ANTERIOR CERVICAL DECOMPRESSION/DISCECTOMY FUSION 4 LEVELS N/A 07/12/2016   Procedure: CERVICAL THREE-FOUR CERVICAL FOUR-FIVE CERVICAL FIVE-SIX CERVICAL SIX-SEVEN  ANTERIOR CERVICAL DECOMPRESSION DISKECTOMY FUSION;  Surgeon: Kevan Ny Ditty, MD;  Location: [facility];  Service: Neurosurgery;  Laterality: N/A;  . breast reduction with implants    . COLONOSCOPY    . PARTIAL COLECTOMY    . TONSILLECTOMY    . TUBAL LIGATION       OB History   No obstetric history on file.     No family history on file.  Social History   Tobacco Use  . Smoking status: Former Research scientist (life sciences)  . Smokeless tobacco: Never Used  Substance Use Topics  . Alcohol use: No  . Drug use: No    Home Medications Prior to Admission medications   Medication Sig Start Date End Date Taking? Authorizing Provider  acyclovir (ZOVIRAX) 200 MG capsule Take 200 mg by mouth 2 (two) times daily.    [provider]  albuterol (PROVENTIL HFA;VENTOLIN HFA) 108 (90 Base) MCG/ACT inhaler Inhale 2 puffs into the lungs every 6 (six) hours as needed for wheezing or shortness  of breath.    [provider]  albuterol (PROVENTIL) (2.5 MG/3ML) 0.083% nebulizer solution Take 2.5 mg by nebulization daily.    [provider]  ALPRAZolam Prudy Feeler) 1 MG tablet Take 1 mg by mouth at bedtime.    [provider]  bisacodyl (DULCOLAX) 5 MG EC tablet Take 10 mg by mouth every evening.    [provider]  carisoprodol (SOMA) 350 MG tablet Take 350 mg by mouth 2 (two) times daily.    [provider]  carisoprodol (SOMA) 350 MG tablet Take 1 tablet (350 mg total) by mouth 4 (four) times daily. 07/13/16   Ditty, Loura Halt, MD  fluticasone (FLONASE) 50 MCG/ACT nasal spray Place 2 sprays into both  nostrils at bedtime.    [provider]  HYDROmorphone (DILAUDID) 2 MG tablet Take 1 tablet (2 mg total) by mouth every 6 (six) hours as needed for severe pain. 07/13/16   Ditty, Loura Halt, MD  hydroxychloroquine (PLAQUENIL) 200 MG tablet Take 200 mg by mouth 2 (two) times daily.    [provider]  loratadine (CLARITIN) 10 MG tablet Take 10 mg by mouth daily as needed for allergies.  07/05/16   [provider]  metFORMIN (GLUCOPHAGE) 500 MG tablet Take 500 mg by mouth 3 (three) times daily. Reported on 07/07/2016    [provider]  montelukast (SINGULAIR) 10 MG tablet Take 10 mg by mouth at bedtime.    [provider]  oxyCODONE-acetaminophen (PERCOCET/ROXICET) 5-325 MG tablet Take 1 tablet by mouth every 6 (six) hours as needed for moderate pain or severe pain.    [provider]  PARoxetine (PAXIL) 10 MG tablet Take 5 mg by mouth daily.    [provider]  simvastatin (ZOCOR) 10 MG tablet Take 10 mg by mouth daily.    [provider]  SUMAtriptan (IMITREX) 25 MG tablet Take 25 mg by mouth every 2 (two) hours as needed for migraine. May repeat in 2 hours if headache persists or recurs.    [provider]  zolpidem (AMBIEN) 10 MG tablet Take 10 mg by mouth at bedtime.    [provider]    Allergies    Morphine and related, Latex, and Percocet [oxycodone-acetaminophen]  Review of Systems   Review of Systems  Constitutional: Negative for chills and fever.  HENT: Negative.   Respiratory: Negative.  Negative for shortness of breath.   Cardiovascular: Negative.  Negative for chest pain.  Gastrointestinal: Positive for abdominal pain and constipation. Negative for nausea and vomiting.  Genitourinary: Positive for frequency. Negative for dysuria.  Musculoskeletal: Negative.  Negative for back pain and myalgias.  Skin: Negative.   Neurological: Negative.     Physical Exam Updated Vital Signs BP (!)  146/81   Pulse 64   Temp 98.4 F (36.9 C)   Resp 15   Ht 5\' 8"  (1.727 m)   Wt 59 kg   SpO2 100%   BMI 19.77 kg/m   Physical Exam Vitals and nursing note reviewed.  Constitutional:      General: She is not in acute distress.    Appearance: She is well-developed. She is not ill-appearing.  HENT:     Head: Normocephalic.  Cardiovascular:     Rate and Rhythm: Normal rate and regular rhythm.     Heart sounds: No murmur.  Pulmonary:     Effort: Pulmonary effort is normal.     Breath sounds: Normal breath sounds.  Abdominal:  General: Bowel sounds are normal. There is no distension.     Palpations: Abdomen is soft.     Tenderness: There is abdominal tenderness in the left lower quadrant. There is guarding. There is no rebound.     Hernia: No hernia is present.  Musculoskeletal:        General: Normal range of motion.     Cervical back: Normal range of motion and neck supple.  Skin:    General: Skin is warm and dry.     Findings: No rash.  Neurological:     Mental Status: She is alert.     Cranial Nerves: No cranial nerve deficit.     ED Results / Procedures / Treatments   Labs (all labs ordered are listed, but only abnormal results are displayed) Labs Reviewed  CBC WITH DIFFERENTIAL/PLATELET  COMPREHENSIVE METABOLIC PANEL  LIPASE, BLOOD  LACTIC ACID, PLASMA  LACTIC ACID, PLASMA    EKG EKG Interpretation  Date/Time:  Monday April 14 2020 20:58:29 EDT Ventricular Rate:  101 PR Interval:    QRS Duration: 91 QT Interval:  333 QTC Calculation: 432 R Axis:   37 Text Interpretation: Sinus tachycardia Low voltage, precordial leads Nonspecific T abnormalities, diffuse leads No old tracing to compare Confirmed by Dione Booze ([id number]) on 04/15/2020 1:21:23 AM   Radiology No results found.  Procedures Procedures (including critical care time)  Medications Ordered in ED Medications  sodium chloride flush (NS) 0.9 % injection 3 mL (has no administration in time  range)  sodium chloride 0.9 % bolus 500 mL (500 mLs Intravenous New Bag/Given 04/15/20 0236)  fentaNYL (SUBLIMAZE) injection 50 mcg (50 mcg Intravenous Given 04/15/20 0236)    ED Course  I have reviewed the triage vital signs and the nursing notes.  Pertinent labs & imaging results that were available during my care of the patient were reviewed by me and considered in my medical decision making (see chart for details).    MDM Rules/Calculators/A&P                      Patient to ED by private transportation from [facility] where she was diagnosed with intussusception yesterday.   Imaging available in PACs which confirms dx. No other records available for review. Labs, IV pending. Pain medications ordered.   Discussed patient's condition with Dr. Cliffton Asters of general surgery who will see the patient for further evaluation.   Per Dr. Cliffton Asters, a repeat CT scan is felt indicated to confirm the diagnosis based on his evaluation of the patient and review of the available resulted tests.   Repeat CT scan confirms the diagnosis of intussusception. Dr. Cliffton Asters is paged with results. On recheck, the patient's pain is managed. Abdomen remains tender, without rigidity. VSS.   Final Clinical Impression(s) / ED Diagnoses Final diagnoses:  None   1. Intussusception   Rx / DC Orders ED Discharge Orders    None       Elpidio Anis, PA-C 04/15/20 0253    Elpidio Anis, PA-C 04/15/20 0710    Dione Booze, MD 04/17/20 1434

## 2020-04-16 ENCOUNTER — Inpatient Hospital Stay (HOSPITAL_COMMUNITY): Payer: Medicaid Other | Admitting: Certified Registered Nurse Anesthetist

## 2020-04-16 ENCOUNTER — Encounter (HOSPITAL_COMMUNITY): Payer: Self-pay

## 2020-04-16 ENCOUNTER — Encounter (HOSPITAL_COMMUNITY): Admission: EM | Disposition: A | Discharge status: Home / Self Care | Payer: Self-pay | Source: Home / Self Care

## 2020-04-16 HISTORY — PX: COLON RESECTION: SHX5231

## 2020-04-16 LAB — BASIC METABOLIC PANEL
Anion gap: 8 (ref 5–15)
BUN: 7 mg/dL — ABNORMAL LOW (ref 8–23)
CO2: 31 mmol/L (ref 22–32)
Calcium: 9.3 mg/dL (ref 8.9–10.3)
Chloride: 103 mmol/L (ref 98–111)
Creatinine, Ser: 0.67 mg/dL (ref 0.44–1.00)
GFR calc Af Amer: >60 mL/min (ref >60)
GFR calc non Af Amer: >60 mL/min (ref >60)
Glucose, Bld: 80 mg/dL (ref 70–99)
Potassium: 4.2 mmol/L (ref 3.5–5.1)
Sodium: 142 mmol/L (ref 135–145)

## 2020-04-16 LAB — GLUCOSE, CAPILLARY
Glucose-Capillary: 65 mg/dL — ABNORMAL LOW (ref 70–99)
Glucose-Capillary: 140 mg/dL — ABNORMAL HIGH (ref 70–99)
Glucose-Capillary: 69 mg/dL — ABNORMAL LOW (ref 70–99)
Glucose-Capillary: 115 mg/dL — ABNORMAL HIGH (ref 70–99)
Glucose-Capillary: 130 mg/dL — ABNORMAL HIGH (ref 70–99)
Glucose-Capillary: 110 mg/dL — ABNORMAL HIGH (ref 70–99)
Glucose-Capillary: 100 mg/dL — ABNORMAL HIGH (ref 70–99)
Glucose-Capillary: 122 mg/dL — ABNORMAL HIGH (ref 70–99)

## 2020-04-16 LAB — SURGICAL PCR SCREEN
MRSA, PCR: NEGATIVE
Staphylococcus aureus: NEGATIVE

## 2020-04-16 LAB — CBC
HCT: 42.2 % (ref 36.0–46.0)
Hemoglobin: 13.4 g/dL (ref 12.0–15.0)
MCH: 30.6 pg (ref 26.0–34.0)
MCHC: 31.8 g/dL (ref 30.0–36.0)
MCV: 96.3 fL (ref 80.0–100.0)
Platelets: 301 10*3/uL (ref 150–400)
RBC: 4.38 MIL/uL (ref 3.87–5.11)
RDW: 12.4 % (ref 11.5–15.5)
WBC: 6 10*3/uL (ref 4.0–10.5)
nRBC: 0 % (ref 0.0–0.2)

## 2020-04-16 SURGERY — LAPAROSCOPIC RIGHT COLON RESECTION
Anesthesia: General

## 2020-04-16 MED ORDER — DEXAMETHASONE SODIUM PHOSPHATE 10 MG/ML IJ SOLN
INTRAMUSCULAR | Status: DC | PRN
  Administered 2020-04-16: 10 mg via INTRAVENOUS

## 2020-04-16 MED ORDER — CHLORHEXIDINE GLUCONATE CLOTH 2 % EX PADS
6.0000 | MEDICATED_PAD | Freq: Once | CUTANEOUS | Status: AC
  Administered 2020-04-16: 6 via TOPICAL

## 2020-04-16 MED ORDER — METRONIDAZOLE IN NACL 5-0.79 MG/ML-% IV SOLN
500.0000 mg | INTRAVENOUS | Status: AC
  Administered 2020-04-16: 14:00:00 500 mg via INTRAVENOUS
  Filled 2020-04-16: qty 100

## 2020-04-16 MED ORDER — SUGAMMADEX SODIUM 200 MG/2ML IV SOLN
INTRAVENOUS | Status: DC | PRN
  Administered 2020-04-16: 200 mg via INTRAVENOUS

## 2020-04-16 MED ORDER — PHENYLEPHRINE 40 MCG/ML (10ML) SYRINGE FOR IV PUSH (FOR BLOOD PRESSURE SUPPORT)
PREFILLED_SYRINGE | INTRAVENOUS | Status: DC | PRN
  Administered 2020-04-16 (×2): 120 ug via INTRAVENOUS

## 2020-04-16 MED ORDER — CIPROFLOXACIN IN D5W 400 MG/200ML IV SOLN
400.0000 mg | INTRAVENOUS | Status: AC
  Administered 2020-04-16: 14:00:00 400 mg via INTRAVENOUS
  Filled 2020-04-16: qty 200

## 2020-04-16 MED ORDER — LIDOCAINE 2% (20 MG/ML) 5 ML SYRINGE
INTRAMUSCULAR | Status: AC
  Filled 2020-04-16: qty 5

## 2020-04-16 MED ORDER — LIDOCAINE 2% (20 MG/ML) 5 ML SYRINGE
INTRAMUSCULAR | Status: DC | PRN
  Administered 2020-04-16: 60 mg via INTRAVENOUS

## 2020-04-16 MED ORDER — ALBUMIN HUMAN 5 % IV SOLN
INTRAVENOUS | Status: AC
  Filled 2020-04-16: qty 250

## 2020-04-16 MED ORDER — PROPOFOL 10 MG/ML IV BOLUS
INTRAVENOUS | Status: DC | PRN
  Administered 2020-04-16: 200 mg via INTRAVENOUS

## 2020-04-16 MED ORDER — BUPIVACAINE HCL (PF) 0.25 % IJ SOLN
INTRAMUSCULAR | Status: DC | PRN
  Administered 2020-04-16: 34 mL

## 2020-04-16 MED ORDER — ENOXAPARIN SODIUM 40 MG/0.4ML ~~LOC~~ SOLN: 40.0000 mg | SUBCUTANEOUS | Status: DC

## 2020-04-16 MED ORDER — ROCURONIUM BROMIDE 10 MG/ML (PF) SYRINGE
PREFILLED_SYRINGE | INTRAVENOUS | Status: AC
  Filled 2020-04-16: qty 10

## 2020-04-16 MED ORDER — ONDANSETRON HCL 4 MG/2ML IJ SOLN
INTRAMUSCULAR | Status: AC
  Filled 2020-04-16: qty 2

## 2020-04-16 MED ORDER — HYDROMORPHONE HCL 1 MG/ML IJ SOLN: 0.2500 mg | INTRAMUSCULAR | Status: DC | PRN

## 2020-04-16 MED ORDER — PROPOFOL 10 MG/ML IV BOLUS
INTRAVENOUS | Status: AC
  Filled 2020-04-16: qty 20

## 2020-04-16 MED ORDER — FENTANYL CITRATE (PF) 100 MCG/2ML IJ SOLN
INTRAMUSCULAR | Status: DC | PRN
  Administered 2020-04-16 (×4): 50 ug via INTRAVENOUS

## 2020-04-16 MED ORDER — PHENYLEPHRINE HCL (PRESSORS) 10 MG/ML IV SOLN
INTRAVENOUS | Status: AC
  Filled 2020-04-16: qty 1

## 2020-04-16 MED ORDER — METHOCARBAMOL 500 MG PO TABS
1000.0000 mg | ORAL_TABLET | Freq: Four times a day (QID) | ORAL | Status: DC | PRN
  Administered 2020-04-16: 1000 mg via ORAL
  Filled 2020-04-16 (×3): qty 2

## 2020-04-16 MED ORDER — SUCCINYLCHOLINE CHLORIDE 200 MG/10ML IV SOSY
PREFILLED_SYRINGE | INTRAVENOUS | Status: DC | PRN
  Administered 2020-04-16: 100 mg via INTRAVENOUS

## 2020-04-16 MED ORDER — HYDROMORPHONE HCL 2 MG/ML IJ SOLN
INTRAMUSCULAR | Status: AC
  Filled 2020-04-16: qty 1

## 2020-04-16 MED ORDER — ONDANSETRON HCL 4 MG/2ML IJ SOLN
INTRAMUSCULAR | Status: DC | PRN
  Administered 2020-04-16: 4 mg via INTRAVENOUS

## 2020-04-16 MED ORDER — DEXTROSE 50 % IV SOLN
INTRAVENOUS | Status: AC
  Administered 2020-04-16: 25 mL
  Filled 2020-04-16: qty 50

## 2020-04-16 MED ORDER — MIDAZOLAM HCL 2 MG/2ML IJ SOLN
INTRAMUSCULAR | Status: AC
  Filled 2020-04-16: qty 2

## 2020-04-16 MED ORDER — LIDOCAINE 5 % EX PTCH
1.0000 | MEDICATED_PATCH | Freq: Every day | CUTANEOUS | Status: DC | PRN
  Filled 2020-04-16 (×2): qty 1

## 2020-04-16 MED ORDER — BUPIVACAINE HCL 0.25 % IJ SOLN
INTRAMUSCULAR | Status: AC
  Filled 2020-04-16: qty 1

## 2020-04-16 MED ORDER — MIDAZOLAM HCL 5 MG/5ML IJ SOLN
INTRAMUSCULAR | Status: DC | PRN
  Administered 2020-04-16: 2 mg via INTRAVENOUS

## 2020-04-16 MED ORDER — ACETAMINOPHEN 10 MG/ML IV SOLN: 1000.0000 mg | Freq: Once | INTRAVENOUS | Status: DC | PRN

## 2020-04-16 MED ORDER — ROCURONIUM BROMIDE 50 MG/5ML IV SOSY
PREFILLED_SYRINGE | INTRAVENOUS | Status: DC | PRN
  Administered 2020-04-16: 20 mg via INTRAVENOUS
  Administered 2020-04-16: 30 mg via INTRAVENOUS

## 2020-04-16 MED ORDER — ALBUMIN HUMAN 5 % IV SOLN: INTRAVENOUS | Status: DC | PRN

## 2020-04-16 MED ORDER — 0.9 % SODIUM CHLORIDE (POUR BTL) OPTIME
TOPICAL | Status: DC | PRN
  Administered 2020-04-16: 2000 mL

## 2020-04-16 MED ORDER — PHENYLEPHRINE 40 MCG/ML (10ML) SYRINGE FOR IV PUSH (FOR BLOOD PRESSURE SUPPORT)
PREFILLED_SYRINGE | INTRAVENOUS | Status: AC
  Filled 2020-04-16: qty 10

## 2020-04-16 MED ORDER — LORAZEPAM 2 MG/ML IJ SOLN
0.5000 mg | Freq: Four times a day (QID) | INTRAMUSCULAR | Status: DC | PRN
  Administered 2020-04-16: 10:00:00 0.5 mg via INTRAVENOUS
  Filled 2020-04-16: qty 1

## 2020-04-16 MED ORDER — FENTANYL CITRATE (PF) 250 MCG/5ML IJ SOLN
INTRAMUSCULAR | Status: AC
  Filled 2020-04-16: qty 5

## 2020-04-16 MED ORDER — PHENYLEPHRINE HCL-NACL 10-0.9 MG/250ML-% IV SOLN
INTRAVENOUS | Status: DC | PRN
  Administered 2020-04-16: 35 ug/min via INTRAVENOUS

## 2020-04-16 MED ORDER — DEXAMETHASONE SODIUM PHOSPHATE 10 MG/ML IJ SOLN
INTRAMUSCULAR | Status: AC
  Filled 2020-04-16: qty 1

## 2020-04-16 MED ORDER — SUCCINYLCHOLINE CHLORIDE 200 MG/10ML IV SOSY
PREFILLED_SYRINGE | INTRAVENOUS | Status: AC
  Filled 2020-04-16: qty 10

## 2020-04-16 MED ORDER — TRAMADOL HCL 50 MG PO TABS
50.0000 mg | ORAL_TABLET | Freq: Four times a day (QID) | ORAL | Status: DC | PRN
  Administered 2020-04-16 – 2020-04-17 (×3): 50 mg via ORAL
  Filled 2020-04-16 (×3): qty 1

## 2020-04-16 SURGICAL SUPPLY — 64 items
APPLIER CLIP 5 13 M/L LIGAMAX5 (MISCELLANEOUS)
APPLIER CLIP ROT 10 11.4 M/L (STAPLE)
CELLS DAT CNTRL 66122 CELL SVR (MISCELLANEOUS) IMPLANT
CHLORAPREP W/TINT 26 (MISCELLANEOUS) ×3 IMPLANT
CLIP APPLIE 5 13 M/L LIGAMAX5 (MISCELLANEOUS) IMPLANT
CLIP APPLIE ROT 10 11.4 M/L (STAPLE) IMPLANT
CLOSURE WOUND 1/2 X4 (GAUZE/BANDAGES/DRESSINGS)
COVER SURGICAL LIGHT HANDLE (MISCELLANEOUS) ×3 IMPLANT
COVER WAND RF STERILE (DRAPES) ×2 IMPLANT
DECANTER SPIKE VIAL GLASS SM (MISCELLANEOUS) ×1 IMPLANT
DERMABOND ADVANCED (GAUZE/BANDAGES/DRESSINGS) ×2
DERMABOND ADVANCED .7 DNX12 (GAUZE/BANDAGES/DRESSINGS) IMPLANT
DEVICE TROCAR PUNCTURE CLOSURE (ENDOMECHANICALS) IMPLANT
DRAPE SHEET LG 3/4 BI-LAMINATE (DRAPES) IMPLANT
ELECT REM PT RETURN 15FT ADLT (MISCELLANEOUS) ×3 IMPLANT
GAUZE SPONGE 4X4 12PLY STRL (GAUZE/BANDAGES/DRESSINGS) ×1 IMPLANT
GLOVE BIOGEL PI IND STRL 7.0 (GLOVE) ×1 IMPLANT
GLOVE BIOGEL PI INDICATOR 7.0 (GLOVE) ×2
GOWN STRL REUS W/TWL LRG LVL3 (GOWN DISPOSABLE) ×3 IMPLANT
GOWN STRL REUS W/TWL XL LVL3 (GOWN DISPOSABLE) ×6 IMPLANT
KIT TURNOVER KIT A (KITS) IMPLANT
NS IRRIG 1000ML POUR BTL (IV SOLUTION) ×6 IMPLANT
PAD POSITIONING PINK XL (MISCELLANEOUS) ×2 IMPLANT
PENCIL SMOKE EVACUATOR (MISCELLANEOUS) IMPLANT
PROTECTOR NERVE ULNAR (MISCELLANEOUS) IMPLANT
RETRACTOR WND ALEXIS 18 MED (MISCELLANEOUS) IMPLANT
RTRCTR WOUND ALEXIS 18CM MED (MISCELLANEOUS)
SCISSORS LAP 5X35 DISP (ENDOMECHANICALS) ×2 IMPLANT
SET IRRIG TUBING LAPAROSCOPIC (IRRIGATION / IRRIGATOR) ×2 IMPLANT
SET TUBE SMOKE EVAC HIGH FLOW (TUBING) ×3 IMPLANT
SHEARS HARMONIC ACE PLUS 36CM (ENDOMECHANICALS) ×1 IMPLANT
SLEEVE XCEL OPT CAN 5 100 (ENDOMECHANICALS) ×2 IMPLANT
SOL ANTI FOG 6CC (MISCELLANEOUS) ×1 IMPLANT
SOLUTION ANTI FOG 6CC (MISCELLANEOUS) ×2
STAPLER VISISTAT 35W (STAPLE) ×1 IMPLANT
STRIP CLOSURE SKIN 1/2X4 (GAUZE/BANDAGES/DRESSINGS) IMPLANT
SUT MNCRL AB 4-0 PS2 18 (SUTURE) ×2 IMPLANT
SUT PDS AB 1 CT1 27 (SUTURE) IMPLANT
SUT PDS AB 1 CTX 36 (SUTURE) IMPLANT
SUT PDS AB 4-0 SH 27 (SUTURE) IMPLANT
SUT PROLENE 2 0 KS (SUTURE) IMPLANT
SUT SILK 2 0 (SUTURE) ×2
SUT SILK 2 0 SH CR/8 (SUTURE) ×3 IMPLANT
SUT SILK 2-0 18XBRD TIE 12 (SUTURE) ×1 IMPLANT
SUT SILK 3 0 (SUTURE) ×2
SUT SILK 3 0 SH CR/8 (SUTURE) ×3 IMPLANT
SUT SILK 3-0 18XBRD TIE 12 (SUTURE) ×1 IMPLANT
SUT VIC AB 2-0 CT1 27 (SUTURE)
SUT VIC AB 2-0 CT1 27XBRD (SUTURE) IMPLANT
SUT VIC AB 3-0 PS2 18 (SUTURE)
SUT VIC AB 3-0 PS2 18XBRD (SUTURE) IMPLANT
SUT VIC AB 4-0 SH 18 (SUTURE) IMPLANT
SUT VICRYL 0 UR6 27IN ABS (SUTURE) IMPLANT
SYR 30ML LL (SYRINGE) ×3 IMPLANT
TAPE CLOTH 4X10 WHT NS (GAUZE/BANDAGES/DRESSINGS) IMPLANT
TRAY CATH 16FR W/PLASTIC CATH (SET/KITS/TRAYS/PACK) ×2 IMPLANT
TRAY COLON PACK (CUSTOM PROCEDURE TRAY) ×3 IMPLANT
TRAY FOLEY MTR SLVR 16FR STAT (SET/KITS/TRAYS/PACK) ×1 IMPLANT
TRAY LAPAROSCOPIC (CUSTOM PROCEDURE TRAY) ×3 IMPLANT
TROCAR BLADELESS OPT 5 100 (ENDOMECHANICALS) ×2 IMPLANT
TROCAR XCEL NON-BLD 11X100MML (ENDOMECHANICALS) IMPLANT
TROCAR XCEL UNIV SLVE 11M 100M (ENDOMECHANICALS) ×2 IMPLANT
TUBING CONNECTING 10 (TUBING) ×4 IMPLANT
TUBING CONNECTING 10' (TUBING) ×2

## 2020-04-16 NOTE — Anesthesia Preprocedure Evaluation (Signed)
Anesthesia Evaluation  Patient identified by MRN, date of birth, ID band Patient awake    Reviewed: Allergy & Precautions, NPO status , Patient's Chart, lab work & pertinent test results  History of Anesthesia Complications (+) PONV  Airway Mallampati: II  TM Distance: >3 FB Neck ROM: Full    Dental no notable dental hx.    Pulmonary COPD,  COPD inhaler, former smoker,    Pulmonary exam normal breath sounds clear to auscultation       Cardiovascular negative cardio ROS Normal cardiovascular exam Rhythm:Regular Rate:Normal     Neuro/Psych Anxiety negative neurological ROS     GI/Hepatic negative GI ROS, Neg liver ROS,   Endo/Other  diabetes, Type 2  Renal/GU negative Renal ROS  negative genitourinary   Musculoskeletal negative musculoskeletal ROS (+)   Abdominal   Peds negative pediatric ROS (+)  Hematology  (+) Blood dyscrasia, , lupus   Anesthesia Other Findings   Reproductive/Obstetrics negative OB ROS                             Anesthesia Physical Anesthesia Plan  ASA: III  Anesthesia Plan: General   Post-op Pain Management:    Induction: Intravenous  PONV Risk Score and Plan: 4 or greater and Ondansetron, Dexamethasone, Midazolam and Treatment may vary due to age or medical condition  Airway Management Planned: Oral ETT  Additional Equipment:   Intra-op Plan:   Post-operative Plan: Extubation in OR  Informed Consent: I have reviewed the patients History and Physical, chart, labs and discussed the procedure including the risks, benefits and alternatives for the proposed anesthesia with the patient or authorized representative who has indicated his/her understanding and acceptance.     Dental advisory given  Plan Discussed with: CRNA and Surgeon  Anesthesia Plan Comments:         Anesthesia Quick Evaluation

## 2020-04-16 NOTE — Op Note (Signed)
Operative Note  Pre-operative Diagnosis:  Small bowel intussusception  Post-operative Diagnosis:  Normal small intestine  Surgeon:  [name], MD  Assistant:  Bailey Mech, PA-C   Procedure:  Diagnostic laparoscopy  Anesthesia:  general  Estimated Blood Loss:  minimal  Drains: none         Specimen: none  Indications: Patient is a 59 year old female admitted to the surgical service with abdominal complaints and CT scan findings consistent with small bowel intussusception.  CT scan showed a proximal jejunal intussusception measuring approximately 6 cm in length.  Patient is now prepared and brought to the operating room for diagnostic laparoscopy and possible small bowel resection.  Procedure:  The patient was seen in the pre-op holding area. The risks, benefits, complications, treatment options, and expected outcomes were previously discussed with the patient. The patient agreed with the proposed plan and has signed the informed consent form.  The patient was brought to the operating room by the surgical team, identified as Hannah Macias and the procedure verified. A "time out" was completed and the above information confirmed.  Following administration of general anesthesia, the patient is positioned and then prepped and draped in the usual aseptic fashion.  After ascertaining that an adequate level of anesthesia been achieved, an infraumbilical incision is made in the midline.  Dissection is carried down to the fascia.  Fascia is incised in the midline and the peritoneal cavity is entered cautiously.  A 0 Vicryl pursestring sutures placed in the fascia.  An Hassan cannula was introduced under direct vision and secured with the pursestring suture.  Abdomen is insufflated with carbon dioxide.  Laparoscope was introduced.  5 mm ports were placed in the left lower quadrant and right lower quadrant of the abdomen under direct vision.  Using Glassman clamps the abdomen is explored.  Liver  appears grossly normal.  Stomach appears normal.  A sending and transverse colon appear normal.  Small bowel appears grossly normal.  Small bowel is mobilized and visualized up to the ligament of Treitz.  Beginning at the ligament of Treitz the small bowel is inspected hand overhand to the ileocecal valve.  There are no signs of current intussusception and no stigmata of recent intussusception.  There is no sign of Meckel's diverticulum.  There is one small area in the mid to distal ileum where there are adhesions of the small bowel to itself which are nonobstructing.  During the procedure, Dr. Angelena Form came to the operating room and observed.  He is in agreement that the entire small bowel appears grossly normal without evidence of intussusception.  Decision is made to terminate the procedure at this point and not perform laparotomy.  Pneumoperitoneum is released.  Ports are removed under direct vision.  Good hemostasis is noted at all port sites.  0 Vicryl pursestring sutures tied securely at the umbilicus.  Port sites are anesthetized with local anesthetic.  Skin incisions are closed with interrupted 4-0 Monocryl subcuticular sutures.  Wounds are washed and dried and Dermabond is applied as dressing.  During the procedure the patient was noted to have a distended bladder.  In and out catheterization was performed prior to awakening the patient from anesthesia.  Patient was then awakened from anesthesia and transported to the recovery room in stable condition.  The patient tolerated the procedure well.   [name], MD [facility] Office: [phone]

## 2020-04-16 NOTE — Transfer of Care (Signed)
Immediate Anesthesia Transfer of Care Note  Patient: Hannah Macias  Procedure(s) Performed: DIAGNOSTIC LAPAROSCOPY, (N/A )  Patient Location: PACU  Anesthesia Type:General  Level of Consciousness: sedated  Airway & Oxygen Therapy: Patient Spontanous Breathing and Patient connected to face mask oxygen  Post-op Assessment: Report given to RN and Post -op Vital signs reviewed and stable  Post vital signs: Reviewed and stable  Last Vitals:  Vitals Value Taken Time  BP 136/78 04/16/20 1514  Temp    Pulse 80 04/16/20 1515  Resp 8 04/16/20 1515  SpO2 98 % 04/16/20 1515  Vitals shown include unvalidated device data.  Last Pain:  Vitals:   04/16/20 1304  TempSrc: Oral  PainSc:       Patients Stated Pain Goal: 1 (04/16/20 1207)  Complications: No apparent anesthesia complications

## 2020-04-16 NOTE — H&P (View-Only) (Signed)
° ° ° °Assessment & Plan: °HD#3 small bowel intussusception ° Patient remains symptomatic this morning ° Plan diagnostic laparoscopy, segmental small bowel resection today ° °I discussed plans for surgery with patient given her persistent symptoms.  We will perform laparoscopy in order to confirm intussusception prior to laparotomy and resection. ° °The risks and benefits of the procedure have been discussed at length with the patient.  The patient understands the proposed procedure, potential alternative treatments, and the course of recovery to be expected.  All of the patient's questions have been answered at this time.  The patient wishes to proceed with surgery. ° °      Anel Purohit, MD °      [facility] °      Office: [phone] ° ° °Chief Complaint: °Abdominal pain, constipation ° °Subjective: °Patient in bed.  "Spasm" last night. ° °Objective: °Vital signs in last 24 hours: °Temp:  [97.6 °F (36.4 °C)-98.1 °F (36.7 °C)] 98.1 °F (36.7 °C) (04/28 0617) °Pulse Rate:  [61-81] 81 (04/28 0617) °Resp:  [12-18] 18 (04/28 0617) °BP: (105-135)/(73-95) 117/95 (04/28 0617) °SpO2:  [94 %-100 %] 98 % (04/28 0617) °  ° °Intake/Output from previous day: °04/27 0701 - 04/28 0700 °In: 1532.8 [I.V.:1532.8] °Out: -  °Intake/Output this shift: °No intake/output data recorded. ° °Physical Exam: °HEENT - sclerae clear, mucous membranes moist °Neck - soft °Chest - clear bilaterally °Cor - RRR °Abdomen - soft without distension; mild tenderness LUQ; no mass °Ext - no edema, non-tender °Neuro - alert & oriented, no focal deficits ° °Lab Results:  °Recent Labs  °  04/15/20 °0233 04/16/20 °0409  °WBC 7.1 6.0  °HGB 12.5 13.4  °HCT 39.2 42.2  °PLT 309 301  ° °BMET °Recent Labs  °  04/15/20 °0233 04/16/20 °0409  °NA 138 142  °K 3.9 4.2  °CL 100 103  °CO2 28 31  °GLUCOSE 97 80  °BUN 10 7*  °CREATININE 0.69 0.67  °CALCIUM 9.4 9.3  ° °PT/INR °No results for input(s): LABPROT, INR in the last 72 hours. °Comprehensive  Metabolic Panel: °   °Component Value Date/Time  ° NA 142 04/16/2020 0409  ° NA 138 04/15/2020 0233  ° K 4.2 04/16/2020 0409  ° K 3.9 04/15/2020 0233  ° CL 103 04/16/2020 0409  ° CL 100 04/15/2020 0233  ° CO2 31 04/16/2020 0409  ° CO2 28 04/15/2020 0233  ° BUN 7 (L) 04/16/2020 0409  ° BUN 10 04/15/2020 0233  ° CREATININE 0.67 04/16/2020 0409  ° CREATININE 0.69 04/15/2020 0233  ° GLUCOSE 80 04/16/2020 0409  ° GLUCOSE 97 04/15/2020 0233  ° CALCIUM 9.3 04/16/2020 0409  ° CALCIUM 9.4 04/15/2020 0233  ° AST 17 04/15/2020 0233  ° ALT 15 04/15/2020 0233  ° ALKPHOS 42 04/15/2020 0233  ° BILITOT 0.5 04/15/2020 0233  ° PROT 7.2 04/15/2020 0233  ° ALBUMIN 4.5 04/15/2020 0233  ° ° °Studies/Results: °CT ABDOMEN PELVIS W CONTRAST ° °Result Date: 04/15/2020 °CLINICAL DATA:  Abdominal pain, recent CT on 04/13/2020 with intussusception EXAM: CT ABDOMEN AND PELVIS WITH CONTRAST TECHNIQUE: Multidetector CT imaging of the abdomen and pelvis was performed using the standard protocol following bolus administration of intravenous contrast. CONTRAST:  100mL OMNIPAQUE IOHEXOL 300 MG/ML  SOLN COMPARISON:  04/13/2020 FINDINGS: Lower chest: The visualized heart size within normal limits. No pericardial fluid/thickening. No hiatal hernia. Streaky atelectasis of both lung bases. Hepatobiliary: Again noted is a 7 cm low-density lesion in the anterior left liver lobe.The main   portal vein is patent. Somewhat nodular cystic area seen within the gallbladder fundus which could be due to adenomyomatosis. No surrounding gallbladder wall thickening. Pancreas: Unremarkable. No pancreatic ductal dilatation or surrounding inflammatory changes. Spleen: Normal in size without focal abnormality. Adrenals/Urinary Tract: Both adrenal glands appear normal. The kidneys and collecting system appear normal without evidence of urinary tract calculus or hydronephrosis. Bladder is unremarkable. Stomach/Bowel: The stomach is normal in appearance. Again noted is an  approximately 6 cm segment of jejunum within the left upper quadrant best seen on series 4, image 69 with a a focal intussusception. There is mild mesentery a swirling seen within this region. No focal wall thickening or surrounding fat stranding changes however are seen. No focal transition point is identified. The remainder of the small bowel is decompressed. There is a rectal anastomosis present. There is a moderate amount of colonic stool. Vascular/Lymphatic: There are no enlarged mesenteric, retroperitoneal, or pelvic lymph nodes. Scattered aortic atherosclerotic calcifications are seen without aneurysmal dilatation. Reproductive: The patient is status post hysterectomy. No adnexal masses or collections seen. Other: No evidence of abdominal wall mass or hernia. Musculoskeletal: No acute or significant osseous findings. IMPRESSION: 1. 6 cm long segment of jejunal intussusception within the left upper quadrant. No evidence of bowel obstruction or focal lead point however identified. This appears grossly unchanged from the prior exam. 2.  Aortic Atherosclerosis (ICD10-I70.0). 3. Bibasilar dependent atelectasis. 4. 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   On: 04/15/2020 06:30  ° ° ° ° °Hannah Macias °04/16/2020 ° °Patient ID: Hannah Macias, female   DOB: 11/15/1961, 59 y.o.   MRN: [id number] ° °

## 2020-04-16 NOTE — Anesthesia Procedure Notes (Signed)
Procedure Name: Intubation Date/Time: 04/16/2020 2:00 PM Performed by: Maxwell Caul, CRNA Pre-anesthesia Checklist: Patient identified, Emergency Drugs available, Suction available and Patient being monitored Patient Re-evaluated:Patient Re-evaluated prior to induction Oxygen Delivery Method: Circle system utilized Preoxygenation: Pre-oxygenation with 100% oxygen Induction Type: IV induction Ventilation: Mask ventilation without difficulty Laryngoscope Size: Mac and 4 Grade View: Grade III Tube type: Oral Tube size: 7.5 mm Number of attempts: 1 Airway Equipment and Method: Stylet Placement Confirmation: ETT inserted through vocal cords under direct vision,  positive ETCO2 and breath sounds checked- equal and bilateral Secured at: 21 cm Tube secured with: Tape Dental Injury: Teeth and Oropharynx as per pre-operative assessment

## 2020-04-16 NOTE — Anesthesia Postprocedure Evaluation (Signed)
Anesthesia Post Note  Patient: Hannah Macias  Procedure(s) Performed: DIAGNOSTIC LAPAROSCOPY, (N/A )     Patient location during evaluation: PACU Anesthesia Type: General Level of consciousness: awake and alert Pain management: pain level controlled Vital Signs Assessment: post-procedure vital signs reviewed and stable Respiratory status: spontaneous breathing, nonlabored ventilation, respiratory function stable and patient connected to nasal cannula oxygen Cardiovascular status: blood pressure returned to baseline and stable Postop Assessment: no apparent nausea or vomiting Anesthetic complications: no    Last Vitals:  Vitals:   04/16/20 1545 04/16/20 1600  BP: 135/80 132/81  Pulse: 82 80  Resp: 12 16  Temp:  36.7 C  SpO2: 96% 97%    Last Pain:  Vitals:   04/16/20 1545  TempSrc:   PainSc: 0-No pain                 Ebonique Hallstrom S

## 2020-04-16 NOTE — Progress Notes (Signed)
Assessment & Plan: HD#3 small bowel intussusception  Patient remains symptomatic this morning  Plan diagnostic laparoscopy, segmental small bowel resection today  I discussed plans for surgery with patient given her persistent symptoms.  We will perform laparoscopy in order to confirm intussusception prior to laparotomy and resection.  The risks and benefits of the procedure have been discussed at length with the patient.  The patient understands the proposed procedure, potential alternative treatments, and the course of recovery to be expected.  All of the patient's questions have been answered at this time.  The patient wishes to proceed with surgery.        [name], MD       [facility]       Office: [phone]   Chief Complaint: Abdominal pain, constipation  Subjective: Patient in bed.  "Spasm" last night.  Objective: Vital signs in last 24 hours: Temp:  [97.6 F (36.4 C)-98.1 F (36.7 C)] 98.1 F (36.7 C) (04/28 0617) Pulse Rate:  [61-81] 81 (04/28 0617) Resp:  [12-18] 18 (04/28 0617) BP: (105-135)/(73-95) 117/95 (04/28 0617) SpO2:  [94 %-100 %] 98 % (04/28 0617)    Intake/Output from previous day: 04/27 0701 - 04/28 0700 In: 1532.8 [I.V.:1532.8] Out: -  Intake/Output this shift: No intake/output data recorded.  Physical Exam: HEENT - sclerae clear, mucous membranes moist Neck - soft Chest - clear bilaterally Cor - RRR Abdomen - soft without distension; mild tenderness LUQ; no mass Ext - no edema, non-tender Neuro - alert & oriented, no focal deficits  Lab Results:  Recent Labs    04/15/20 0233 04/16/20 0409  WBC 7.1 6.0  HGB 12.5 13.4  HCT 39.2 42.2  PLT 309 301   BMET Recent Labs    04/15/20 0233 04/16/20 0409  NA 138 142  K 3.9 4.2  CL 100 103  CO2 28 31  GLUCOSE 97 80  BUN 10 7*  CREATININE 0.69 0.67  CALCIUM 9.4 9.3   PT/INR No results for input(s): LABPROT, INR in the last 72 hours. Comprehensive  Metabolic Panel:    Component Value Date/Time   NA 142 04/16/2020 0409   NA 138 04/15/2020 0233   K 4.2 04/16/2020 0409   K 3.9 04/15/2020 0233   CL 103 04/16/2020 0409   CL 100 04/15/2020 0233   CO2 31 04/16/2020 0409   CO2 28 04/15/2020 0233   BUN 7 (L) 04/16/2020 0409   BUN 10 04/15/2020 0233   CREATININE 0.67 04/16/2020 0409   CREATININE 0.69 04/15/2020 0233   GLUCOSE 80 04/16/2020 0409   GLUCOSE 97 04/15/2020 0233   CALCIUM 9.3 04/16/2020 0409   CALCIUM 9.4 04/15/2020 0233   AST 17 04/15/2020 0233   ALT 15 04/15/2020 0233   ALKPHOS 42 04/15/2020 0233   BILITOT 0.5 04/15/2020 0233   PROT 7.2 04/15/2020 0233   ALBUMIN 4.5 04/15/2020 0233    Studies/Results: CT ABDOMEN PELVIS W CONTRAST  Result Date: 04/15/2020 CLINICAL DATA:  Abdominal pain, recent CT on 04/13/2020 with intussusception EXAM: CT ABDOMEN AND PELVIS WITH CONTRAST TECHNIQUE: Multidetector CT imaging of the abdomen and pelvis was performed using the standard protocol following bolus administration of intravenous contrast. CONTRAST:  OMNIPAQUE IOHEXOL 300 MG/ML  SOLN COMPARISON:  04/13/2020 FINDINGS: Lower chest: The visualized heart size within normal limits. No pericardial fluid/thickening. No hiatal hernia. Streaky atelectasis of both lung bases. Hepatobiliary: Again noted is a 7 cm low-density lesion in the anterior left liver lobe.The main  portal vein is patent. Somewhat nodular cystic area seen within the gallbladder fundus which could be due to adenomyomatosis. No surrounding gallbladder wall thickening. Pancreas: Unremarkable. No pancreatic ductal dilatation or surrounding inflammatory changes. Spleen: Normal in size without focal abnormality. Adrenals/Urinary Tract: Both adrenal glands appear normal. The kidneys and collecting system appear normal without evidence of urinary tract calculus or hydronephrosis. Bladder is unremarkable. Stomach/Bowel: The stomach is normal in appearance. Again noted is an  approximately 6 cm segment of jejunum within the left upper quadrant best seen on series 4, image 69 with a a focal intussusception. There is mild mesentery a swirling seen within this region. No focal wall thickening or surrounding fat stranding changes however are seen. No focal transition point is identified. The remainder of the small bowel is decompressed. There is a rectal anastomosis present. There is a moderate amount of colonic stool. Vascular/Lymphatic: There are no enlarged mesenteric, retroperitoneal, or pelvic lymph nodes. Scattered aortic atherosclerotic calcifications are seen without aneurysmal dilatation. Reproductive: The patient is status post hysterectomy. No adnexal masses or collections seen. Other: No evidence of abdominal wall mass or hernia. Musculoskeletal: No acute or significant osseous findings. IMPRESSION: 1. 6 cm long segment of jejunal intussusception within the left upper quadrant. No evidence of bowel obstruction or focal lead point however identified. This appears grossly unchanged from the prior exam. 2.  Aortic Atherosclerosis (ICD10-I70.0). 3. Bibasilar dependent atelectasis. 4. 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   On: 04/15/2020 06:30      Hannah Macias 04/16/2020  Patient ID: Hannah Macias, female   DOB: 1961/02/06, 59 y.o.   MRN: [id number]

## 2020-04-16 NOTE — Interval H&P Note (Signed)
History and Physical Interval Note:  04/16/2020 1:16 PM  Hannah Macias  has presented today for surgery, with the diagnosis of small bowel obstruction.  The various methods of treatment have been discussed with the patient and family. After consideration of risks, benefits and other options for treatment, the patient has consented to    Procedure(s): DIAGNOSTIC LAPAROSCOPY POSSIBLE OPEN INTUSSUSCEPTION SMALL BOWEL RESECTION (N/A) as a surgical intervention.    The patient's history has been reviewed, patient examined, no change in status, stable for surgery.  I have reviewed the patient's chart and labs.  Questions were answered to the patient's satisfaction.    [name], MD [facility] Office: [phone]   [name]

## 2020-04-17 ENCOUNTER — Encounter: Payer: Self-pay | Admitting: Nurse Practitioner

## 2020-04-17 LAB — GLUCOSE, CAPILLARY: Glucose-Capillary: 86 mg/dL (ref 70–99)

## 2020-04-17 MED ORDER — OXYCODONE HCL 5 MG PO TABS: 5.0000 mg | ORAL_TABLET | Freq: Four times a day (QID) | ORAL | Status: DC | PRN

## 2020-04-17 MED ORDER — ACETAMINOPHEN 500 MG PO TABS: 1000.0000 mg | ORAL_TABLET | Freq: Four times a day (QID) | ORAL | 0 refills | Status: AC

## 2020-04-17 MED ORDER — OXYCODONE HCL 5 MG PO TABS: 5.0000 mg | ORAL_TABLET | Freq: Four times a day (QID) | ORAL | 0 refills | Status: DC | PRN

## 2020-04-17 MED ORDER — IBUPROFEN 600 MG PO TABS: 600.0000 mg | ORAL_TABLET | Freq: Four times a day (QID) | ORAL | 0 refills | Status: AC | PRN

## 2020-04-17 NOTE — Discharge Summary (Signed)
Patient ID: Hannah Macias [id number] 03/04/1961 59 y.o.  Admit date: 04/15/2020 Discharge date: 04/17/2020  Admitting Diagnosis: Jejunal intussusception  Discharge Diagnosis Patient Active Problem List   Diagnosis Date Noted  . Intussusception of jejunum ([place]) 04/15/2020  . Cervical spondylosis with radiculopathy 07/12/2016  s/p dx lap with no findings.  Consultants none  Reason for Admission: Hannah Macias is an 59 y.o. female with hx of IBS-C, emphysema, lupus (on plaquenil), DM, HLD, fibromyalgia, postop nausea/vomiting, remote hx diverticulitis (underwent lap sigmoid 2009 in [place]) whom presented to ED at [facility] 04/13/20 with a 2 day history of left flank crampy sensations. She reports this felt similar to when she has been constipated from her IBS but lasted longer than normal. She wondered if she was having recurrent diverticulitis but hadn't had any issues with this since her sigmoidectomy 2009. She denies any history of nausea, vomiting, bloating, obstipation. She reports she has been "constipated" for the last week or two but has been having firm hard stool and continued to pass gas. She has been eating and drinking fine without issue through all of this. She presented to [facility] ED, was seen and evaluated and had CT that reportedly showed evident jejunal intussusception in LUQ. There was no obstruction. She was told she may need surgery but stated she didn't trust the surgeons there so left on her own accord on 3/25. The following day, she then came to [place]. She has been stable here with similar symptoms, no abdominal distention nor pain and clinically has looked fine. She had a normal WBC and lactate. Given significant history of constipation/IBS, stool burden on CT and potential for the small bowel intussusception to have been transient oral contrast CT Was obtained and we have been asked to see.  Procedures Diagnostic laparoscopy, no findings of intussusception or lead  point, Dr. Harlow Asa 04/16/20  Hospital Course:  The patient was admitted and the following day taken for dx laparoscopy.  No acute findings or lead points were found and no findings of persistent intussusception.  No intervention was taken at that time.  Her diet was advanced as tolerated and on POD 1, she was otherwise stable for DC home.  We have recommended outpatient GI follow up as well for capsule endo or further work up to eval for etiology of intussusception noted on CT scan.    Physical Exam: Abd: soft, appropriately tender, +BS, ND, incisions c/d/i  Allergies as of 04/17/2020      Reactions   Morphine And Related Other (See Comments)   Can't breathe - Panic attack   Latex Itching, Swelling, Rash   Percocet [oxycodone-acetaminophen]    "speeds me up"   Amoxicillin-pot Clavulanate Rash      Medication List    STOP taking these medications   HYDROmorphone 2 MG tablet Commonly known as: DILAUDID     TAKE these medications   acetaminophen 500 MG tablet Commonly known as: TYLENOL Take 2 tablets (1,000 mg total) by mouth every 6 (six) hours.   albuterol 108 (90 Base) MCG/ACT inhaler Commonly known as: VENTOLIN HFA Inhale 2 puffs into the lungs every 6 (six) hours as needed for wheezing or shortness of breath.   albuterol (2.5 MG/3ML) 0.083% nebulizer solution Commonly known as: PROVENTIL Take 2.5 mg by nebulization daily.   ALPRAZolam 1 MG tablet Commonly known as: XANAX Take 1 mg by mouth at bedtime.   carisoprodol 350 MG tablet Commonly known as: SOMA Take 1 tablet (350 mg total) by mouth  4 (four) times daily.   DULoxetine 60 MG capsule Commonly known as: CYMBALTA Take 120 mg by mouth at bedtime.   fluticasone 50 MCG/ACT nasal spray Commonly known as: FLONASE Place 2 sprays into both nostrils at bedtime.   hydroxychloroquine 200 MG tablet Commonly known as: PLAQUENIL Take 200 mg by mouth 2 (two) times daily.   ibuprofen 600 MG tablet Commonly known as:  ADVIL Take 1 tablet (600 mg total) by mouth every 6 (six) hours as needed (pain not controlled with tylenol).   Latuda 40 MG Tabs tablet Generic drug: lurasidone Take 40 mg by mouth daily.   loratadine 10 MG tablet Commonly known as: CLARITIN Take 10 mg by mouth daily as needed for allergies.   metFORMIN 500 MG tablet Commonly known as: GLUCOPHAGE Take 500 mg by mouth 3 (three) times daily. Reported on 07/07/2016   methylphenidate 20 MG tablet Commonly known as: RITALIN Take 20 mg by mouth 3 (three) times daily.   oxyCODONE 5 MG immediate release tablet Commonly known as: Oxy IR/ROXICODONE Take 1 tablet (5 mg total) by mouth every 6 (six) hours as needed for moderate pain.   simvastatin 10 MG tablet Commonly known as: ZOCOR Take 10 mg by mouth daily.   traZODone 50 MG tablet Commonly known as: DESYREL Take 50-100 mg by mouth at bedtime.        Follow-up Information    [facility] Follow up in 3 week(s).   Specialty: General Surgery Why: our office will call you with appointment date and time Contact information: [address] [place] [address] [phone]        gastroenterologist Follow up.   Why: follow up with GI for capsule endoscopy or further work up for intussusception          Signed: Barnetta Chapel, [facility] 04/17/2020, 9:49 AM Please see Amion for pager number during day hours 7:00am-4:30pm, 7-11:30am on Weekends

## 2020-04-17 NOTE — Progress Notes (Signed)
Assessment unchanged. Pt and daughter verbalized understanding through teach back. Discharged via wc to front entrance accompanied by NT and daughter.

## 2020-04-17 NOTE — Discharge Instructions (Signed)
[facility]  Please arrive at least 30 min before your appointment to complete your check in paperwork.  If you are unable to arrive 30 min prior to your appointment time we may have to cancel or reschedule you. LAPAROSCOPIC SURGERY: POST OP INSTRUCTIONS Always review your discharge instruction sheet given to you by the facility where your surgery was performed. IF YOU HAVE DISABILITY OR FAMILY LEAVE FORMS, YOU MUST BRING THEM TO THE OFFICE FOR PROCESSING.   DO NOT GIVE THEM TO YOUR DOCTOR.  PAIN CONTROL  1. First take acetaminophen (Tylenol) AND/or ibuprofen (Advil) to control your pain after surgery.  Follow directions on package.  Taking acetaminophen (Tylenol) and/or ibuprofen (Advil) regularly after surgery will help to control your pain and lower the amount of prescription pain medication you may need.  You should not take more than 4,000 mg (4 grams) of acetaminophen (Tylenol) in 24 hours.  You should not take ibuprofen (Advil), aleve, motrin, naprosyn or other NSAIDS if you have a history of stomach ulcers or chronic kidney disease.  2. A prescription for pain medication may be given to you upon discharge.  Take your pain medication as prescribed, if you still have uncontrolled pain after taking acetaminophen (Tylenol) or ibuprofen (Advil). 3. Use ice packs to help control pain. 4. If you need a refill on your pain medication, please contact your pharmacy.  They will contact our office to request authorization. Prescriptions will not be filled after 5pm or on week-ends.  HOME MEDICATIONS 5. Take your usually prescribed medications unless otherwise directed.  DIET 6. You should follow a light diet the first few days after arrival home.  Be sure to include lots of fluids daily. Avoid fatty, fried foods.   CONSTIPATION 7. It is common to experience some constipation after surgery and if you are taking pain medication.  Increasing fluid intake and taking a stool  softener (such as Colace) will usually help or prevent this problem from occurring.  A mild laxative (Milk of Magnesia or Miralax) should be taken according to package instructions if there are no bowel movements after 48 hours.  WOUND/INCISION CARE 8. Most patients will experience some swelling and bruising in the area of the incisions.  Ice packs will help.  Swelling and bruising can take several days to resolve.  9. Unless discharge instructions indicate otherwise, follow guidelines below  a. STERI-STRIPS - you may remove your outer bandages 48 hours after surgery, and you may shower at that time.  You have steri-strips (small skin tapes) in place directly over the incision.  These strips should be left on the skin for 7-10 days.   b. DERMABOND/SKIN GLUE - you may shower in 24 hours.  The glue will flake off over the next 2-3 weeks. 10. Any sutures or staples will be removed at the office during your follow-up visit.  ACTIVITIES 11. You may resume regular (light) daily activities beginning the next day--such as daily self-care, walking, climbing stairs--gradually increasing activities as tolerated.  You may have sexual intercourse when it is comfortable.  Refrain from any heavy lifting or straining until approved by your doctor. a. You may drive when you are no longer taking prescription pain medication, you can comfortably wear a seatbelt, and you can safely maneuver your car and apply brakes.  FOLLOW-UP 12. You should see your doctor in the office for a follow-up appointment approximately 2-3 weeks after your surgery.  You should have been given your post-op/follow-up appointment when   your surgery was scheduled.  If you did not receive a post-op/follow-up appointment, make sure that you call for this appointment within a day or two after you arrive home to insure a convenient appointment time.   WHEN TO CALL YOUR DOCTOR: 1. Fever over 101.0 2. Inability to urinate 3. Continued bleeding from  incision. 4. Increased pain, redness, or drainage from the incision. 5. Increasing abdominal pain  The clinic staff is available to answer your questions during regular business hours.  Please don't hesitate to call and ask to speak to one of the nurses for clinical concerns.  If you have a medical emergency, go to the nearest emergency room or call 911.  A surgeon from [facility] is always on call at the hospital. [address], [address], [place], [place]  [address] ? [address] [address], [place], [place]   [address] [phone] ? [phone] ? FAX [phone]  .........   Managing Your Pain After Surgery Without Opioids    Thank you for participating in our program to help patients manage their pain after surgery without opioids. This is part of our effort to provide you with the best care possible, without exposing you or your family to the risk that opioids pose.  What pain can I expect after surgery? You can expect to have some pain after surgery. This is normal. The pain is typically worse the day after surgery, and quickly begins to get better. Many studies have found that many patients are able to manage their pain after surgery with Over-the-Counter (OTC) medications such as Tylenol and Motrin. If you have a condition that does not allow you to take Tylenol or Motrin, notify your surgical team.  How will I manage my pain? The best strategy for controlling your pain after surgery is around the clock pain control with Tylenol (acetaminophen) and Motrin (ibuprofen or Advil). Alternating these medications with each other allows you to maximize your pain control. In addition to Tylenol and Motrin, you can use heating pads or ice packs on your incisions to help reduce your pain.  How will I alternate your regular strength over-the-counter pain medication? You will take a dose of pain medication every three hours. ; Start by taking 650 mg of Tylenol (2 pills of 325  mg) ; 3 hours later take 600 mg of Motrin (3 pills of 200 mg) ; 3 hours after taking the Motrin take 650 mg of Tylenol ; 3 hours after that take 600 mg of Motrin.   - 1 -  See example - if your first dose of Tylenol is at 12:00 PM   12:00 PM Tylenol 650 mg (2 pills of 325 mg)  3:00 PM Motrin 600 mg (3 pills of 200 mg)  6:00 PM Tylenol 650 mg (2 pills of 325 mg)  9:00 PM Motrin 600 mg (3 pills of 200 mg)  Continue alternating every 3 hours   We recommend that you follow this schedule around-the-clock for at least 3 days after surgery, or until you feel that it is no longer needed. Use the table on the last page of this handout to keep track of the medications you are taking. Important: Do not take more than 3000mg of Tylenol or 3200mg of Motrin in a 24-hour period. Do not take ibuprofen/Motrin if you have a history of bleeding stomach ulcers, severe kidney disease, &/or actively taking a blood thinner  What if I still have pain? If you have pain that is not   controlled with the over-the-counter pain medications (Tylenol and Motrin or Advil) you might have what we call "breakthrough" pain. You will receive a prescription for a small amount of an opioid pain medication such as Oxycodone, Tramadol, or Tylenol with Codeine. Use these opioid pills in the first 24 hours after surgery if you have breakthrough pain. Do not take more than 1 pill every 4-6 hours.  If you still have uncontrolled pain after using all opioid pills, don't hesitate to call our staff using the number provided. We will help make sure you are managing your pain in the best way possible, and if necessary, we can provide a prescription for additional pain medication.   Day 1    Time  Name of Medication Number of pills taken  Amount of Acetaminophen  Pain Level   Comments  AM PM       AM PM       AM PM       AM PM       AM PM       AM PM       AM PM       AM PM       Total Daily amount of Acetaminophen Do not  take more than  3,000 mg per day      Day 2    Time  Name of Medication Number of pills taken  Amount of Acetaminophen  Pain Level   Comments  AM PM       AM PM       AM PM       AM PM       AM PM       AM PM       AM PM       AM PM       Total Daily amount of Acetaminophen Do not take more than  3,000 mg per day      Day 3    Time  Name of Medication Number of pills taken  Amount of Acetaminophen  Pain Level   Comments  AM PM       AM PM       AM PM       AM PM          AM PM       AM PM       AM PM       AM PM       Total Daily amount of Acetaminophen Do not take more than  3,000 mg per day      Day 4    Time  Name of Medication Number of pills taken  Amount of Acetaminophen  Pain Level   Comments  AM PM       AM PM       AM PM       AM PM       AM PM       AM PM       AM PM       AM PM       Total Daily amount of Acetaminophen Do not take more than  3,000 mg per day      Day 5    Time  Name of Medication Number of pills taken  Amount of Acetaminophen  Pain Level   Comments  AM PM       AM PM       AM   PM       AM PM       AM PM       AM PM       AM PM       AM PM       Total Daily amount of Acetaminophen Do not take more than  3,000 mg per day       Day 6    Time  Name of Medication Number of pills taken  Amount of Acetaminophen  Pain Level  Comments  AM PM       AM PM       AM PM       AM PM       AM PM       AM PM       AM PM       AM PM       Total Daily amount of Acetaminophen Do not take more than  3,000 mg per day      Day 7    Time  Name of Medication Number of pills taken  Amount of Acetaminophen  Pain Level   Comments  AM PM       AM PM       AM PM       AM PM       AM PM       AM PM       AM PM       AM PM       Total Daily amount of Acetaminophen Do not take more than  3,000 mg per day        For additional information about how and where to safely dispose of unused  opioid medications - https://www.morepowerfulnc.org  Disclaimer: This document contains information and/or instructional materials adapted from [facility] for the typical patient with your condition. It does not replace medical advice from your health care provider because your experience may differ from that of the typical patient. Talk to your health care provider if you have any questions about this document, your condition or your treatment plan. Adapted from [facility]   

## 2020-04-28 ENCOUNTER — Ambulatory Visit: Payer: Medicaid Other | Admitting: Nurse Practitioner

## 2020-05-14 ENCOUNTER — Telehealth: Payer: Self-pay | Admitting: *Deleted

## 2020-05-14 ENCOUNTER — Encounter: Payer: Self-pay | Admitting: Gastroenterology

## 2020-05-14 ENCOUNTER — Ambulatory Visit: Payer: Medicaid Other | Admitting: Gastroenterology

## 2020-05-14 VITALS — BP 92/68 | HR 80 | Ht 68.0 in | Wt 161.0 lb

## 2020-05-14 DIAGNOSIS — R109 Unspecified abdominal pain: Secondary | ICD-10-CM

## 2020-05-14 DIAGNOSIS — K561 Intussusception: Secondary | ICD-10-CM

## 2020-05-14 NOTE — Patient Instructions (Signed)
If you are age 61 or older, your body mass index should be between 23-30. Your Body mass index is 24.48 kg/m. If this is out of the aforementioned range listed, please consider follow up with your Primary Care Provider.  If you are age 95 or younger, your body mass index should be between 19-25. Your Body mass index is 24.48 kg/m. If this is out of the aformentioned range listed, please consider follow up with your Primary Care Provider.     CAPSOCAM CAPSULE ENDOSCOPY PATIENT INSTRUCTION Hannah Macias 08/22/1961 [id number]   1. 05/16/20 Seven (7) days prior to capsule endoscopy stop taking iron supplements and carafate.  2. 05/21/20 Two (2) days prior to capsule endoscopy stop taking aspirin or any arthritis drugs.  3. 05/22/20 Day before capsule endoscopy purchase a 238 gram bottle of Miralax from the laxative section of your drug store, and a 32 oz. bottle of Gatorade (no red).    4. 05/22/20 One (1) day prior to capsule endoscopy: a) Stop smoking. b) Eat a regular diet until 12:00 Noon. c) After 12:00 Noon take only the following: Black coffee  Jell-O (no fruit or red Jell-o) Water   Bouillon (chicken or beef) 7-Up   Cranberry Juice Tea   Kool-Aid Popsicle (not red) Sprite   Coke Ginger Ale  Pepsi Mountain Dew Gatorade d) At 6:00 pm the evening before your appointment, drink 7 capfuls (105 grams) of Miralax with 32 oz. Gatorade. Drink 8 oz every 15 minutes until gone. e) Nothing to eat or drink after midnight except medications with a sip of water.  5. 05/23/20 Day of capsule endoscopy:            Do not have anything to eat or drink after midnight No medications for 2 hours prior to your test.  6. Please arrive at [facility]  3rd floor patient registration area by 8:30 am on: Friday 05/23/20.   For any questions: Call [place] at [phone] and ask to speak with one of the capsule endoscopy nurses.   What you should know.   You have been  scheduled for a Capsule Endoscopy with Capsocam.  You will swallow a vitamin size pill that contains cameras that will take pictures of your small intestine (bowel).  Your small bowel connects to your stomach on one end and the large intestine or bowel on the other. The capsule moves through the gastrointestinal tract taking pictures along the way. The images are stored on the capsule.  1-5 days after the capsule ingestion you will retrieve the capsule and mail it in a prepaid envelope to [facility] to download the images.  Your physician will be provided a copy of the images to review.   Who should have a capsule endoscopy?  You may need a small bowel capsule endoscopy if you have symptoms, such as blood in your stool, chronic pain, diarrhea, or unexplained anemia. The small intestine is a hollow organ that cannot be easily visualized with a scope due to its length.  The capsule endoscopy allows your physician to directly visualize the intestine.  The pictures may show intestine growths, inflammation, or bleeding in the small intestine.   What are the risks with a capsule endoscopy?  The pictures may not be clear or give [place] a clear cause of your symptoms The capsule may become trapped in the esophagus or intestines.  You may need surgery or endoscopic procedures to remove the capsule. You may not have a MRI until  the capsule endoscopy has been retrieved.   How do I retrieve the capsule?  You will be provided a kit with step-by-step instructions for capsule retrieval and mailing.  You should expect to retrieve the capsule in 1-5 days after ingestion.  This will depend on your individual bowel habits.

## 2020-05-14 NOTE — Progress Notes (Signed)
Reviewed and agree with management plans. ? ?Shayra Anton L. Shemika Robbs, MD, MPH  ?

## 2020-05-14 NOTE — Telephone Encounter (Signed)
Spoke with patient and informed her Dr. Orvan Falconer recommends a CT enterography. Patient voiced understanding and agreed with plan.

## 2020-05-14 NOTE — Progress Notes (Signed)
05/14/2020 Hannah Macias [id number] 21-Nov-1961   HISTORY OF PRESENT ILLNESS: This is a 59 year old female who is new to our office.  She was referred here in order to discuss and possibly schedule a wireless capsule endoscopy as suggested by the surgeons.  She tells me that for the past year she has been having intermittent episodes of abdominal pain and severe diarrhea.  She says that initially when it started she was having diarrhea like water 30 times per day for 6 months.  She thought that she had a parasite because she had eaten sushi earlier in the day before she got sick.  She says that she stopped eating dairy and gluten and was taking all kinds of digestive enzymes.  She never received medical attention due to Covid and fear of contracting the illness.  After about 6 months the episodes continued, but not as severe.  She describes now having episodes of what she says are stomach spasms.  Says that she can actually feel and see her intestines moving.  She will then also get diarrhea nonstop.  In between these episodes she is usually constipated.  She says that recently she has been having about 4 normal bowel movements per month.  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  She went to surgery the next day, but by the time they did surgery the intussusception had reduced.  The surgeons recommended GI follow-up for possible wireless capsule endoscopy.  She has a history of colon resection for diverticulitis, which was performed in  [place].  Her last colonoscopy was by Dr. Jennye Boroughs in [place] in April 2019 at which time she was found to have an anastomosis at 15 cm that was unremarkable.  Also still had a few scattered diverticula in the remaining colon.  Repeat was recommended at a 10-year interval.   Past Medical History:  Diagnosis Date  . ADHD (attention deficit hyperactivity disorder)   . Anxiety    takes Xanax at bedtime  . Arthritis   . Chronic constipation    Dulcolax nightly  . COPD (chronic obstructive pulmonary disease) (HCC)    Neb inhaler and ALbuterol inhaler  . Depression    takes Paxil daily  . Diabetes mellitus without complication (HCC)    takes metformin daily  . Diverticulitis   . History of blood transfusion    no abnormal reaction noted  . History of bronchitis 12/2015  . History of shingles   . Hyperlipidemia    takes Simvastatin daily  . Joint pain   . Lupus (HCC)    takes Plaquenil daily  . Migraines    takes Imitrex daily  . Pneumonia    hx of  . PONV (postoperative nausea and vomiting)   . Seasonal allergies    takes Claritin daily as needed.Flonase and Singulair at bedtime  . Seizures (HCC)    hx of-21 yrs ago.Was on Seizure meds but has been off > 5 yrs    Past Surgical History:  Procedure Laterality Date  .  ABDOMINAL HYSTERECTOMY    . ANTERIOR CERVICAL DECOMPRESSION/DISCECTOMY FUSION 4 LEVELS N/A 07/12/2016   Procedure: CERVICAL THREE-FOUR CERVICAL FOUR-FIVE CERVICAL FIVE-SIX CERVICAL SIX-SEVEN  ANTERIOR CERVICAL DECOMPRESSION DISKECTOMY FUSION;  Surgeon: Kevan Ny Ditty, MD;  Location: [place] [facility];  Service: Neurosurgery;  Laterality: N/A;  . breast reduction with implants    . COLON RESECTION N/A 04/16/2020   Procedure: DIAGNOSTIC LAPAROSCOPY,;  Surgeon: Armandina Gemma, MD;  Location: [facility] OR[facility];  Service: General;  Laterality: N/A;  . COLONOSCOPY    . PARTIAL COLECTOMY    . TONSILLECTOMY    . TUBAL LIGATION      reports that she has quit smoking. Her smoking  use included cigarettes. She has never used smokeless tobacco. She reports that she does not drink alcohol or use drugs. family history includes Diabetes in her mother; Esophageal cancer in her paternal grandmother; Heart attack in her father; Lupus in her father; Other in her mother; Stomach cancer in her paternal grandmother. Allergies  Allergen Reactions  . Morphine And Related Other (See Comments)    Can't breathe - Panic attack  . Latex Itching, Swelling and Rash  . Percocet [Oxycodone-Acetaminophen]     "speeds me up"  . Amoxicillin-Pot Clavulanate Rash      Outpatient Encounter Medications as of 05/14/2020  Medication Sig  . acetaminophen (TYLENOL) 500 MG tablet Take 2 tablets (1,000 mg total) by mouth every 6 (six) hours.  Marland Kitchen albuterol (PROVENTIL HFA;VENTOLIN HFA) 108 (90 Base) MCG/ACT inhaler Inhale 2 puffs into the lungs every 6 (six) hours as needed for wheezing or shortness of breath.  Marland Kitchen albuterol (PROVENTIL) (2.5 MG/3ML) 0.083% nebulizer solution Take 2.5 mg by nebulization daily.  Marland Kitchen ALPRAZolam (XANAX) 1 MG tablet Take 1 mg by mouth at bedtime.  . DULoxetine (CYMBALTA) 60 MG capsule Take 60 mg by mouth at bedtime.   . fluticasone (FLONASE) 50 MCG/ACT nasal spray Place 2 sprays into both nostrils at bedtime.  . hydroxychloroquine (PLAQUENIL) 200 MG tablet Take 200 mg by mouth 2 (two) times daily.  Marland Kitchen ibuprofen (ADVIL) 600 MG tablet Take 1 tablet (600 mg total) by mouth every 6 (six) hours as needed (pain not controlled with tylenol).  Marland Kitchen LATUDA 40 MG TABS tablet Take 40 mg by mouth daily.  Marland Kitchen loratadine (CLARITIN) 10 MG tablet Take 10 mg by mouth daily as needed for allergies.   . metFORMIN (GLUCOPHAGE) 500 MG tablet Take 500 mg by mouth 3 (three) times daily. Reported on 07/07/2016  . methylphenidate (RITALIN) 20 MG tablet Take 20 mg by mouth 3 (three) times daily.  . simvastatin (ZOCOR) 10 MG tablet Take 10 mg by mouth daily.  . traZODone (DESYREL) 50 MG tablet Take 50-100 mg by  mouth at bedtime.  . [DISCONTINUED] carisoprodol (SOMA) 350 MG tablet Take 1 tablet (350 mg total) by mouth 4 (four) times daily. (Patient not taking: Reported on 04/15/2020)  . [DISCONTINUED] oxyCODONE (OXY IR/ROXICODONE) 5 MG immediate release tablet Take 1 tablet (5 mg total) by mouth every 6 (six) hours as needed for moderate pain. (Patient not taking: Reported on 05/14/2020)   No facility-administered encounter medications on file as of 05/14/2020.     REVIEW OF SYSTEMS  : All other systems reviewed and negative except where noted in the History of Present Illness.   PHYSICAL EXAM: BP 92/68 (BP Location: Left Arm, Patient Position: Sitting, Cuff Size: Normal)   Pulse 80   Ht 5\' 8"  (1.727 m)   Wt 161 lb (73 kg)  SpO2 99%   BMI 24.48 kg/m  General: Well developed white female in no acute distress Head: Normocephalic and atraumatic Eyes:  Sclerae anicteric, conjunctiva pink. Ears: Normal auditory acuity Lungs: Clear throughout to auscultation; no increased WOB. Heart: Regular rate and rhythm; no M/R/G. Abdomen: Soft, non-distended.  Mild diffuse TTP. Musculoskeletal: Symmetrical with no gross deformities  Skin: No lesions on visible extremities Extremities: No edema  Neurological: Alert oriented x 4, grossly non-focal Psychological:  Alert and cooperative. Normal mood and affect  ASSESSMENT AND PLAN: *59 year old female with complaints of intermittent abdominal pain and diarrheal episodes for the past year.  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  At the time surgery was performed it had reduced.  She continues to have these episodes.  Surgery suggested capsule endoscopy.  After the patient's visit I discussed with Dr. Orvan Falconer who suggested CT enterography instead as that would likely be better yield without risk of capsule retention, etc.  We will plan for CT  enterography.   CC:  Johny Drilling, MD

## 2020-05-14 NOTE — Addendum Note (Signed)
Addended by: Mariane Duval on: 05/14/2020 04:53 PM   Modules accepted: Orders

## 2020-05-15 NOTE — Telephone Encounter (Signed)
Pt stated that she is returning your call.  °

## 2020-05-16 NOTE — Telephone Encounter (Addendum)
Scheduled CT enterography for Thursday 05/29/20 @ 10:40 at [facility] [address] [place], [place].

## 2020-05-16 NOTE — Telephone Encounter (Signed)
Pt returned your call, she is asking if you can r/s ct scan for a different day. She said that she has other two appts on 6/10. Any other day is fine.

## 2020-05-16 NOTE — Telephone Encounter (Signed)
Left message for patient to call the office

## 2020-05-20 NOTE — Telephone Encounter (Signed)
Informed patient that switched CT to 6/16 @ 11:40 am. Patient requested a sooner date. Gave patient information to call [facility] for a sooner date.

## 2020-05-20 NOTE — Telephone Encounter (Signed)
Patient is calling to follow up on previous message 

## 2020-05-29 ENCOUNTER — Other Ambulatory Visit: Payer: Medicaid Other

## 2020-06-04 ENCOUNTER — Ambulatory Visit
Admission: RE | Admit: 2020-06-04 | Discharge: 2020-06-04 | Disposition: A | Discharge status: Home / Self Care | Payer: Medicaid Other | Source: Ambulatory Visit | Attending: Gastroenterology | Admitting: Gastroenterology

## 2020-06-04 DIAGNOSIS — K561 Intussusception: Secondary | ICD-10-CM

## 2020-06-04 DIAGNOSIS — R109 Unspecified abdominal pain: Secondary | ICD-10-CM

## 2020-06-04 MED ORDER — IOPAMIDOL (ISOVUE-300) INJECTION 61%
100.0000 mL | Freq: Once | INTRAVENOUS | Status: AC | PRN
  Administered 2020-06-04: 100 mL via INTRAVENOUS

## 2020-06-05 ENCOUNTER — Telehealth: Payer: Self-pay

## 2020-06-05 NOTE — Telephone Encounter (Signed)
Zehr, Princella Pellegrini, PA-C  Loretha Stapler, RN Please let the patient know that her CT enterography once again demonstrated the intussusception in her jejunum. Much shorter segment than was previously noted, however. Dr. Orvan Falconer reviewed the results. She does not want to have her undergo capsule endoscopy for risk of capsule retention in that area. She recommends surgical follow-up.   Thank you,   Jess       Previous Messages   ----- Message -----  From: Tressia Danas, MD  Sent: 06/04/2020  4:56 PM EDT  To: Princella Pellegrini Zehr, PA-C   I recommend surgical input given these results. Thank you.   ----- Message -----  From: Leta Baptist, PA-C  Sent: 06/04/2020  4:49 PM EDT  To: Tressia Danas, MD   Wanted to get your input about this. Surgery had suggested a capsule endoscopy, but you wanted the enterography to reduce risk of capsule retention complications, etc. Does she just need to be referred back to surgery?   Thank you,   Jess  ----- Message -----  From: Interface, Rad Results In  Sent: 06/04/2020  4:25 PM EDT  To: Leta Baptist, PA-C

## 2020-06-05 NOTE — Telephone Encounter (Signed)
Left message on machine to call back  

## 2020-06-05 NOTE — Telephone Encounter (Signed)
The pt has been advised and referral to [facility] has been made.  The pt will call if she has not heard from that office in 1 week.

## 2020-07-22 DIAGNOSIS — N951 Menopausal and female climacteric states: Secondary | ICD-10-CM | POA: Insufficient documentation

## 2020-09-11 DIAGNOSIS — J3089 Other allergic rhinitis: Secondary | ICD-10-CM | POA: Insufficient documentation

## 2020-09-11 DIAGNOSIS — B029 Zoster without complications: Secondary | ICD-10-CM | POA: Insufficient documentation

## 2020-09-18 ENCOUNTER — Telehealth: Payer: Self-pay | Admitting: Gastroenterology

## 2020-09-18 NOTE — Telephone Encounter (Signed)
Left message on machine to call back  

## 2020-09-18 NOTE — Telephone Encounter (Signed)
The pt states that [facility] called her after referral was made but told her that she did not need an appt with their office to come back to see Dr Orvan Falconer.  I have resent the records for referral and will also send to Dr Orvan Falconer for review.  I called [facility] and was told that an appt was made for her but the pt called and cancelled.

## 2020-09-19 NOTE — Telephone Encounter (Signed)
Given her CTE results, follow-up with surgery is the most appropriate step. Thank you for confirming the referral and offered appointment. Would she prefer to go to an academic center such as [facility] for a second opinion from a surgeon? She may have office follow-up with Shanda Bumps if she wishes, but, given her CTE findings there is little that we can offer at this time. Thank you.

## 2020-09-19 NOTE — Telephone Encounter (Signed)
The pt will await a call from [facility] for another appt to be made.

## 2020-11-25 DIAGNOSIS — E559 Vitamin D deficiency, unspecified: Secondary | ICD-10-CM | POA: Insufficient documentation

## 2021-01-12 DIAGNOSIS — E538 Deficiency of other specified B group vitamins: Secondary | ICD-10-CM | POA: Insufficient documentation

## 2021-04-23 ENCOUNTER — Other Ambulatory Visit: Payer: Self-pay

## 2021-04-23 ENCOUNTER — Encounter: Payer: Self-pay | Admitting: Podiatry

## 2021-04-23 ENCOUNTER — Ambulatory Visit: Payer: Medicaid Other | Admitting: Podiatry

## 2021-04-23 DIAGNOSIS — L603 Nail dystrophy: Secondary | ICD-10-CM | POA: Diagnosis not present

## 2021-04-23 NOTE — Patient Instructions (Signed)
Urea 40% cream or gel. Available on [employer]. Can use on nails and calluses.  Something like this:

## 2021-04-23 NOTE — Progress Notes (Signed)
  Subjective:  Patient ID: Hannah Macias, female    DOB: Feb 20, 1961,  MRN: [id number]  No chief complaint on file.   60 y.o. female presents with the above complaint. History confirmed with patient. States she has lupus and gets a fungus infection every year. States the nails are thickened and she would like to discuss options.  Takes biotin.  Objective:  Physical Exam: warm, good capillary refill, no trophic changes or ulcerative lesions, normal DP and PT pulses and normal sensory exam. Hallux extensus bilat. Adductovarus 5th toes bilat. Hammertoes lesser digits. Distal thickening to nails 1/2 bilat, 5th nails bilat Assessment:   1. Nail dystrophy    Plan:  Patient was evaluated and treated and all questions answered.  Nail dystrophy -Educated on etiology. Most likely traumatic changes. -Recommend 40% urea -Debrided courtesy.  Return if symptoms worsen or fail to improve.
